# Patient Record
Sex: Female | Born: 1980
Health system: Southern US, Community
[De-identification: ages and names within clinical notes are randomized; demographics above are authoritative.]

## PROBLEM LIST (undated history)

## (undated) DIAGNOSIS — Z789 Other specified health status: Secondary | ICD-10-CM

## (undated) HISTORY — PX: WISDOM TOOTH EXTRACTION: SHX21

---
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
## 2001-02-28 ENCOUNTER — Other Ambulatory Visit: Admission: RE | Admit: 2001-02-28 | Discharge: 2001-02-28 | Payer: Self-pay | Admitting: Obstetrics and Gynecology

## 2001-03-02 ENCOUNTER — Inpatient Hospital Stay (HOSPITAL_COMMUNITY): Admission: AD | Admit: 2001-03-02 | Discharge: 2001-03-02 | Payer: Self-pay | Admitting: Obstetrics and Gynecology

## 2001-05-02 ENCOUNTER — Encounter: Payer: Self-pay | Admitting: Obstetrics and Gynecology

## 2001-05-02 ENCOUNTER — Ambulatory Visit (HOSPITAL_COMMUNITY): Admission: RE | Admit: 2001-05-02 | Discharge: 2001-05-02 | Payer: Self-pay | Admitting: Obstetrics and Gynecology

## 2001-05-13 ENCOUNTER — Inpatient Hospital Stay (HOSPITAL_COMMUNITY): Admission: AD | Admit: 2001-05-13 | Discharge: 2001-05-15 | Payer: Self-pay | Admitting: Obstetrics and Gynecology

## 2001-05-16 ENCOUNTER — Encounter: Admission: RE | Admit: 2001-05-16 | Discharge: 2001-06-15 | Payer: Self-pay | Admitting: Obstetrics and Gynecology

## 2001-07-22 ENCOUNTER — Other Ambulatory Visit: Admission: RE | Admit: 2001-07-22 | Discharge: 2001-07-22 | Payer: Self-pay | Admitting: Obstetrics and Gynecology

## 2002-08-02 ENCOUNTER — Encounter: Payer: Self-pay | Admitting: Family Medicine

## 2002-08-02 ENCOUNTER — Ambulatory Visit (HOSPITAL_COMMUNITY): Admission: RE | Admit: 2002-08-02 | Discharge: 2002-08-02 | Payer: Self-pay | Admitting: Family Medicine

## 2004-03-14 ENCOUNTER — Other Ambulatory Visit: Admission: RE | Admit: 2004-03-14 | Discharge: 2004-03-14 | Payer: Self-pay | Admitting: Obstetrics and Gynecology

## 2005-03-17 ENCOUNTER — Other Ambulatory Visit: Admission: RE | Admit: 2005-03-17 | Discharge: 2005-03-17 | Payer: Self-pay | Admitting: Obstetrics and Gynecology

## 2005-03-20 ENCOUNTER — Ambulatory Visit (HOSPITAL_COMMUNITY): Admission: RE | Admit: 2005-03-20 | Discharge: 2005-03-20 | Payer: Self-pay | Admitting: Plastic Surgery

## 2005-03-20 ENCOUNTER — Ambulatory Visit (HOSPITAL_BASED_OUTPATIENT_CLINIC_OR_DEPARTMENT_OTHER): Admission: RE | Admit: 2005-03-20 | Discharge: 2005-03-20 | Payer: Self-pay | Admitting: Plastic Surgery

## 2006-03-04 ENCOUNTER — Other Ambulatory Visit: Admission: RE | Admit: 2006-03-04 | Discharge: 2006-03-04 | Payer: Self-pay | Admitting: Obstetrics and Gynecology

## 2006-09-08 ENCOUNTER — Other Ambulatory Visit: Admission: RE | Admit: 2006-09-08 | Discharge: 2006-09-08 | Payer: Self-pay | Admitting: Obstetrics and Gynecology

## 2006-10-23 ENCOUNTER — Inpatient Hospital Stay (HOSPITAL_COMMUNITY): Admission: AD | Admit: 2006-10-23 | Discharge: 2006-10-23 | Payer: Self-pay | Admitting: Obstetrics and Gynecology

## 2006-11-15 ENCOUNTER — Inpatient Hospital Stay (HOSPITAL_COMMUNITY): Admission: RE | Admit: 2006-11-15 | Discharge: 2006-11-15 | Payer: Self-pay | Admitting: Obstetrics and Gynecology

## 2006-12-21 ENCOUNTER — Inpatient Hospital Stay (HOSPITAL_COMMUNITY): Admission: AD | Admit: 2006-12-21 | Discharge: 2006-12-21 | Payer: Self-pay | Admitting: Obstetrics and Gynecology

## 2006-12-21 ENCOUNTER — Inpatient Hospital Stay (HOSPITAL_COMMUNITY): Admission: AD | Admit: 2006-12-21 | Discharge: 2006-12-23 | Payer: Self-pay | Admitting: Obstetrics and Gynecology

## 2009-01-06 IMAGING — CT CT HEAD W/O CM
2 of 3 series · 17 of 30 positions shown, 20 images · non-contrast
Comparison: None.

CLINICAL DATA: Headaches. Syncopal episodes today. 32 weeks pregnant.

HEAD CT WITHOUT CONTRAST
TECHNIQUE: 5mm collimated images were obtained from the base of the skull
through the vertex, according to standard protocol, without contrast.

[Series 400: reformatted · sagittal · 0.47mm/px · 5 of 67 slices shown (1 of 2)]
[im 7/67  brain]
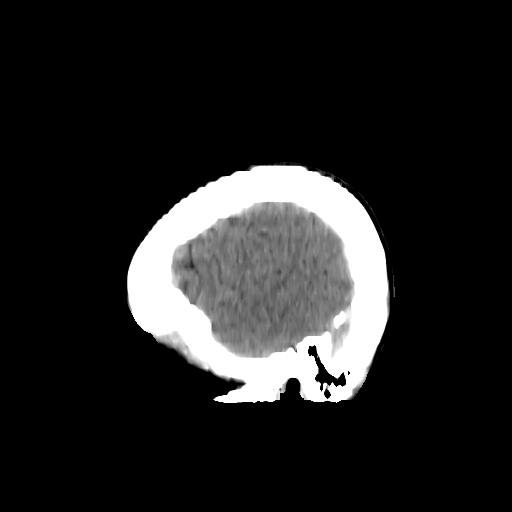
[im 14/67  brain]
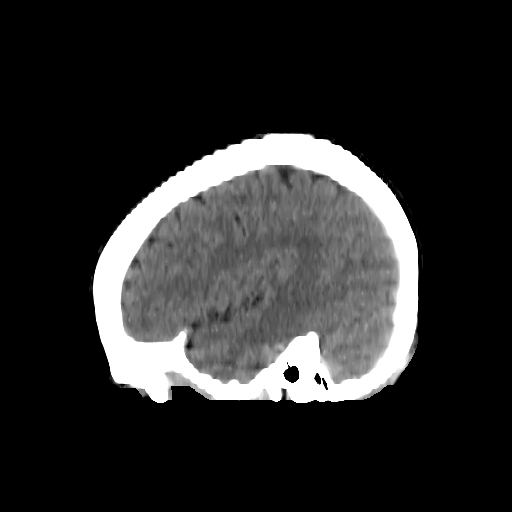
[im 20/67  brain]
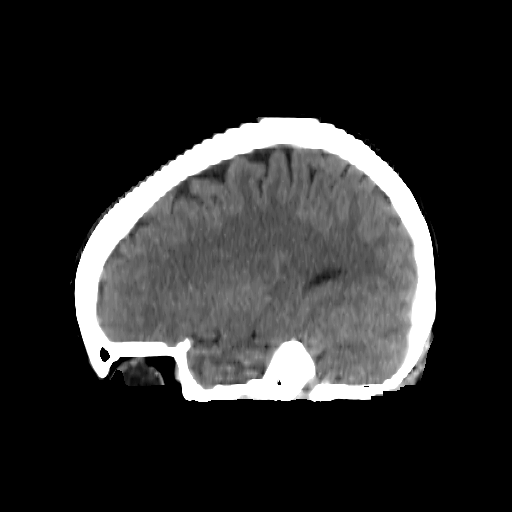
[im 27/67  brain]
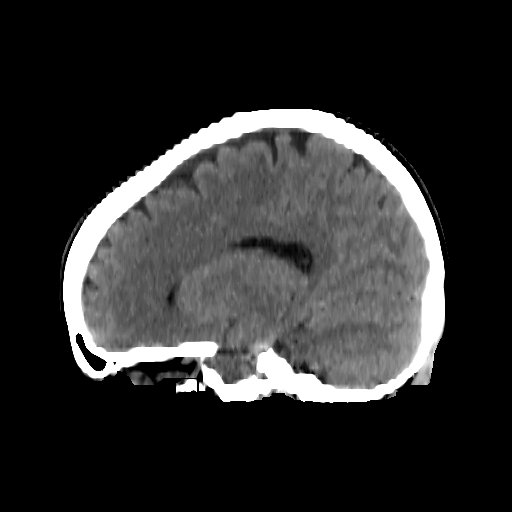
[im 40/67  brain]
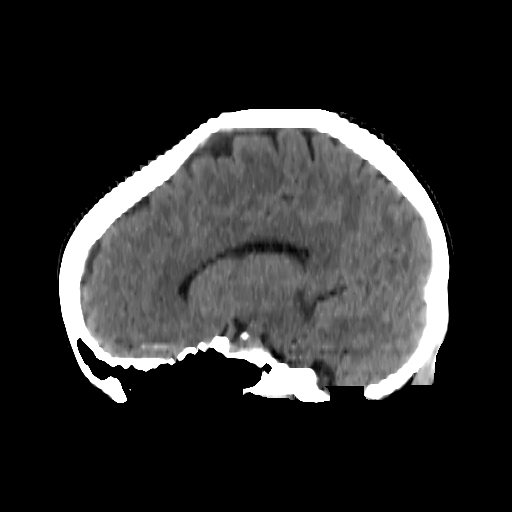

[Series 401: reformatted · coronal · 0.47mm/px · 12 of 86 slices shown, 15 images (2 of 2)]
[im 7/86  brain]
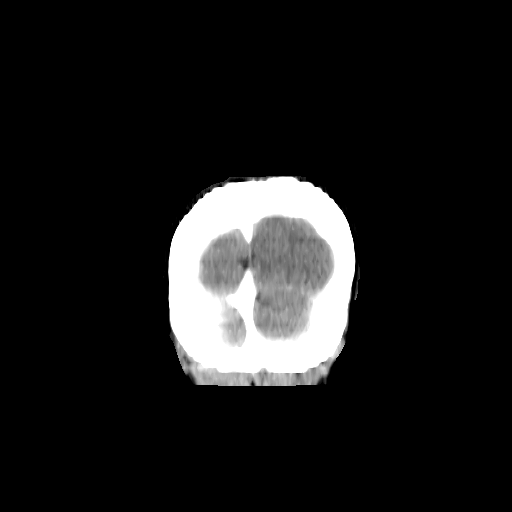
[im 7/86  bone]
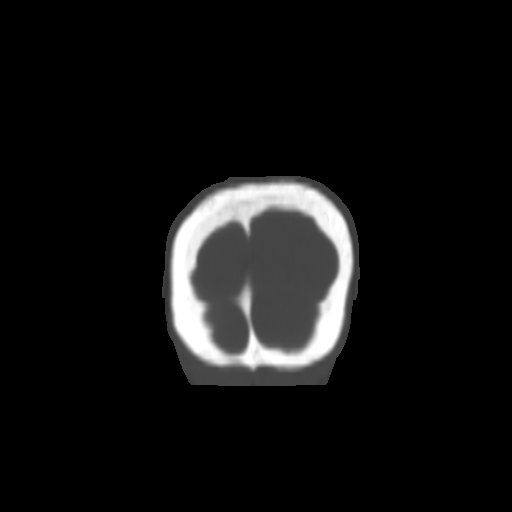
[im 14/86  brain]
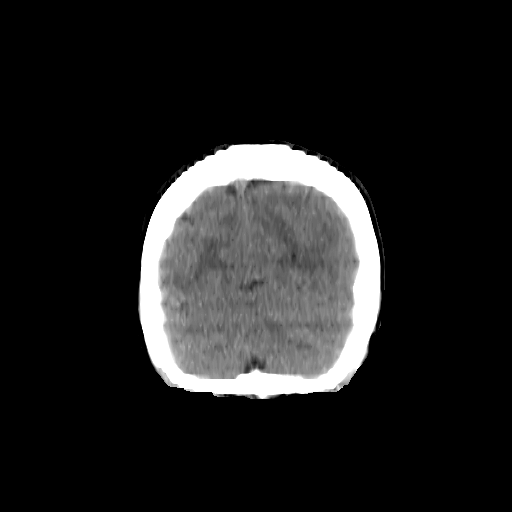
[im 20/86  brain]
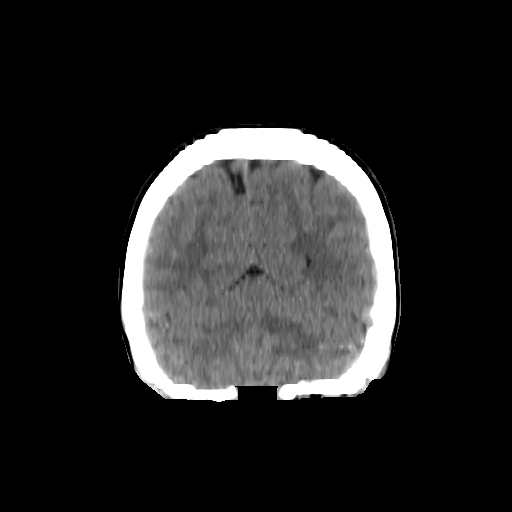
[im 27/86  brain]
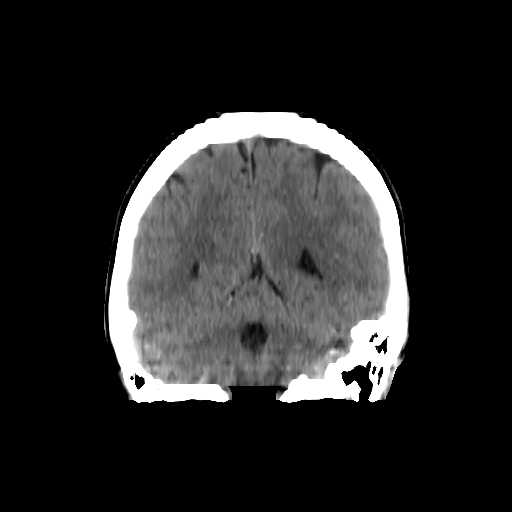
[im 33/86  brain]
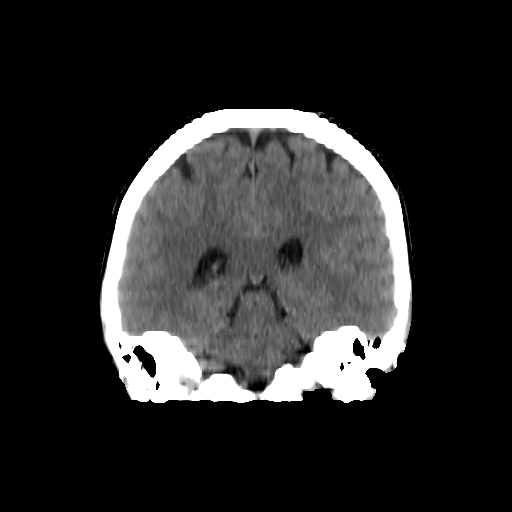
[im 33/86  bone]
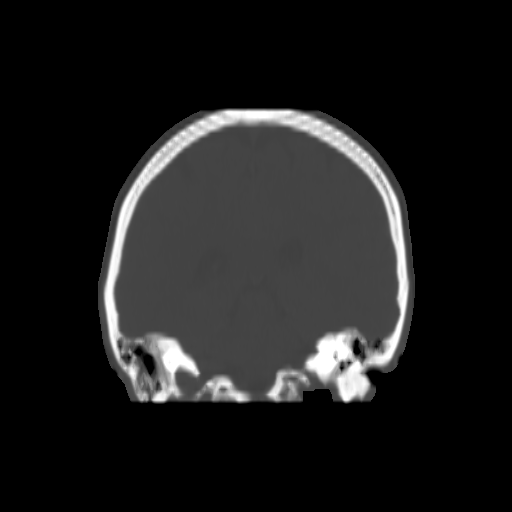
[im 40/86  brain]
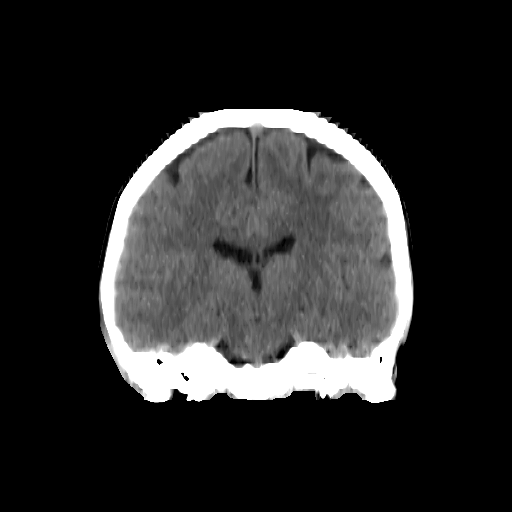
[im 46/86  brain]
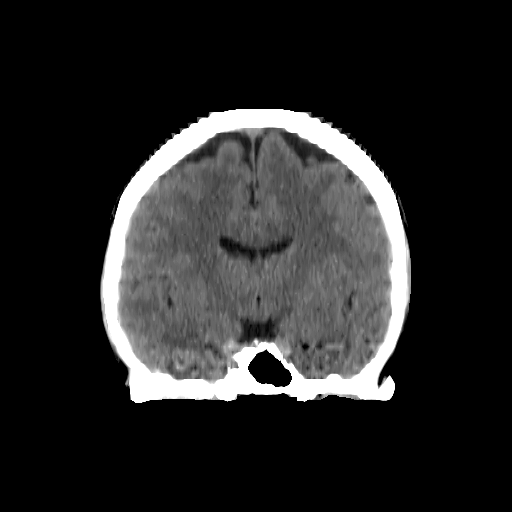
[im 53/86  brain]
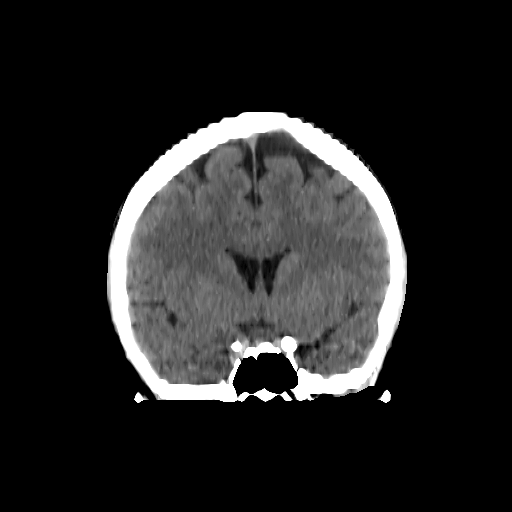
[im 59/86  brain]
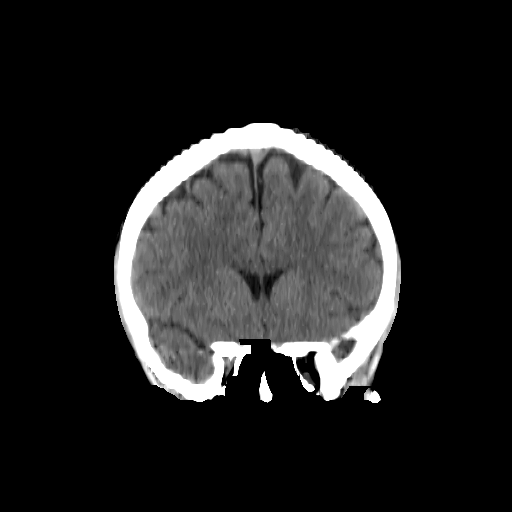
[im 59/86  bone]
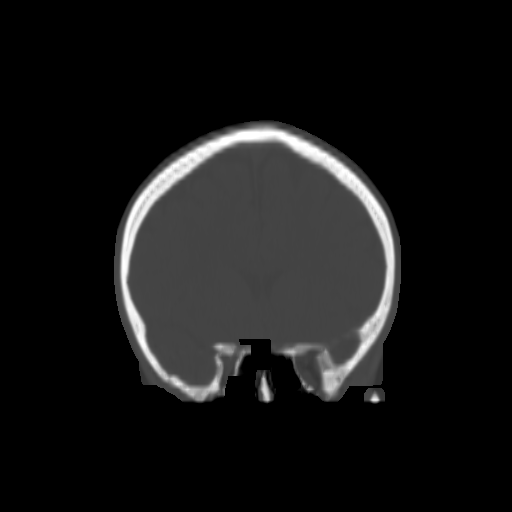
[im 66/86  brain]
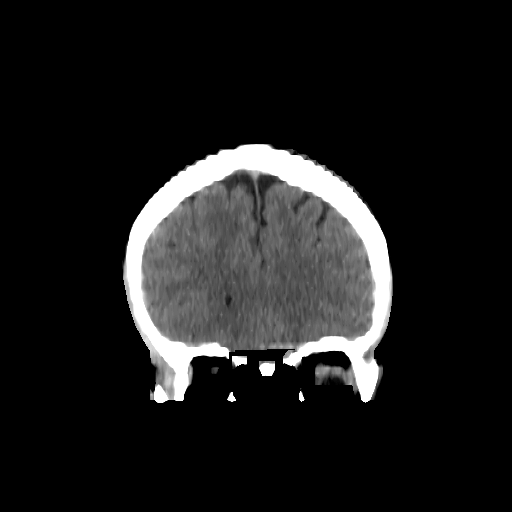
[im 72/86  brain]
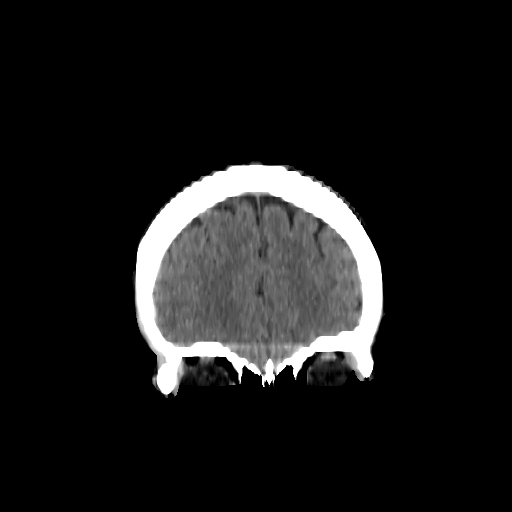
[im 79/86  brain]
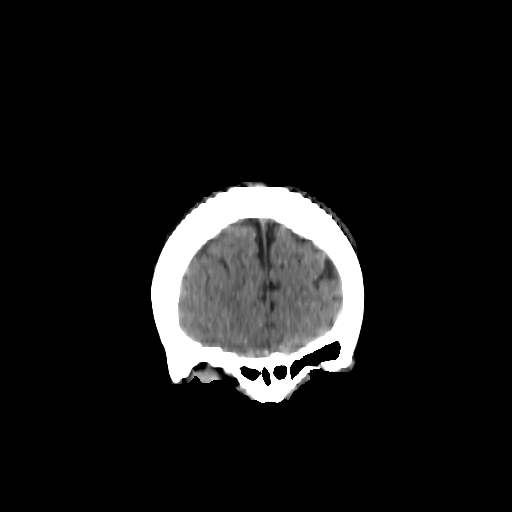

[17 of 30 positions shown; findings below may reference images not displayed]

FINDINGS: Normal appearing cerebral hemispheres and posterior fossa structures.
Normal size and position of the ventricles.  No intracranial hemorrhage, mass,
mass effect or areas of acute infarction identified.  Unremarkable bones and
included portions of the paranasal sinuses.

IMPRESSION

Normal examination.

## 2012-01-15 ENCOUNTER — Ambulatory Visit: Payer: Self-pay | Admitting: Obstetrics and Gynecology

## 2012-01-26 ENCOUNTER — Ambulatory Visit: Payer: Self-pay | Admitting: Obstetrics and Gynecology

## 2012-06-27 ENCOUNTER — Ambulatory Visit: Payer: Self-pay | Admitting: Obstetrics and Gynecology

## 2012-08-24 ENCOUNTER — Ambulatory Visit: Payer: Self-pay | Admitting: Obstetrics and Gynecology

## 2013-11-02 NOTE — L&D Delivery Note (Signed)
0150: Patient reporting increased rectal pressure.  Provider to room and fetus at +2 station.  Patient instructed on pushing techniques.  FHR remained reassuring with variable decelerations noted during pushing, but with quick recovery during rest.  Patient delivered as below.   Delivery Note At 2:08 AM a viable female "Anne Cline" was delivered via Vaginal, Spontaneous Delivery (Presentation: Right Occiput Anterior).  Compound right hand noted, but shoulders delivered easily.  Mother requests and allowed to pull infant to abdomen where nurse provided tactile stimulation and bulb suction.  Infant with spontaneous cry and APGARs of 9, 9; weight 7lbs. Cord clamped, cut, and blood collected. Placenta delivered spontaneously and noted to be intact with 3VC upon inspection. Vaginal inspection revealed intact perineum and vaginal mucosa: several skin tags noted in these areas.  Fundus firm at umbilicus, bleeding scant, mother hemodynamically stable, and infant skin to skin prior to provider exit.    Anesthesia: Epidural  Episiotomy: None Lacerations: None Suture Repair: N/A Est. Blood Loss (mL): 200  Mom to postpartum.  Baby to Couplet care / Skin to Skin. Desires inpatient circumcision  [name] LYNN 07/25/2014, 2:45 AM

## 2013-12-14 LAB — OB RESULTS CONSOLE GC/CHLAMYDIA
Chlamydia: NEGATIVE
Gonorrhea: NEGATIVE

## 2013-12-14 LAB — OB RESULTS CONSOLE RUBELLA ANTIBODY, IGM: Rubella: IMMUNE

## 2013-12-14 LAB — OB RESULTS CONSOLE RPR: RPR: NONREACTIVE

## 2013-12-14 LAB — OB RESULTS CONSOLE ABO/RH: RH Type: NEGATIVE

## 2013-12-14 LAB — OB RESULTS CONSOLE HEPATITIS B SURFACE ANTIGEN: Hepatitis B Surface Ag: NEGATIVE

## 2013-12-14 LAB — OB RESULTS CONSOLE HIV ANTIBODY (ROUTINE TESTING): HIV: NONREACTIVE

## 2013-12-14 LAB — OB RESULTS CONSOLE ANTIBODY SCREEN: Antibody Screen: NEGATIVE

## 2014-07-10 LAB — OB RESULTS CONSOLE GBS: GBS: NEGATIVE

## 2014-07-13 ENCOUNTER — Encounter (HOSPITAL_COMMUNITY): Payer: Self-pay | Admitting: *Deleted

## 2014-07-13 ENCOUNTER — Inpatient Hospital Stay (HOSPITAL_COMMUNITY)
Admission: AD | Admit: 2014-07-13 | Discharge: 2014-07-13 | Disposition: A | Discharge status: Home / Self Care | Payer: BC Managed Care – PPO | Source: Ambulatory Visit | Attending: Obstetrics and Gynecology | Admitting: Obstetrics and Gynecology

## 2014-07-13 DIAGNOSIS — M549 Dorsalgia, unspecified: Secondary | ICD-10-CM | POA: Insufficient documentation

## 2014-07-13 DIAGNOSIS — O479 False labor, unspecified: Secondary | ICD-10-CM | POA: Insufficient documentation

## 2014-07-13 DIAGNOSIS — O99891 Other specified diseases and conditions complicating pregnancy: Secondary | ICD-10-CM | POA: Insufficient documentation

## 2014-07-13 DIAGNOSIS — O9989 Other specified diseases and conditions complicating pregnancy, childbirth and the puerperium: Principal | ICD-10-CM

## 2014-07-13 HISTORY — DX: Other specified health status: Z78.9

## 2014-07-13 LAB — URINE MICROSCOPIC-ADD ON

## 2014-07-13 LAB — URINALYSIS, ROUTINE W REFLEX MICROSCOPIC
Bilirubin Urine: NEGATIVE
Glucose, UA: NEGATIVE mg/dL
Ketones, ur: 40 mg/dL — AB
Nitrite: NEGATIVE
Protein, ur: NEGATIVE mg/dL
Specific Gravity, Urine: 1.025 (ref 1.005–1.030)
Urobilinogen, UA: 0.2 mg/dL (ref 0.0–1.0)
pH: 5.5 (ref 5.0–8.0)

## 2014-07-13 MED ORDER — CEPHALEXIN 500 MG PO CAPS: 500.0000 mg | ORAL_CAPSULE | Freq: Two times a day (BID) | ORAL | Status: AC

## 2014-07-13 NOTE — MAU Note (Signed)
Lower pelvic pain & pressure, back & leg pain, irregular uc's, nausea, HA.  Bloody show, denies LOF.

## 2014-07-13 NOTE — MAU Provider Note (Signed)
MAU Addendum Note  Results for orders placed during the hospital encounter of 07/13/14 (from the past 24 hour(s))  URINALYSIS, ROUTINE W REFLEX MICROSCOPIC     Status: Abnormal   Collection Time    07/13/14 12:50 PM      Result Value Ref Range   Color, Urine YELLOW  YELLOW   APPearance HAZY (*) CLEAR   Specific Gravity, Urine 1.025  1.005 - 1.030   pH 5.5  5.0 - 8.0   Glucose, UA NEGATIVE  NEGATIVE mg/dL   Hgb urine dipstick SMALL (*) NEGATIVE   Bilirubin Urine NEGATIVE  NEGATIVE   Ketones, ur 40 (*) NEGATIVE mg/dL   Protein, ur NEGATIVE  NEGATIVE mg/dL   Urobilinogen, UA 0.2  0.0 - 1.0 mg/dL   Nitrite NEGATIVE  NEGATIVE   Leukocytes, UA SMALL (*) NEGATIVE  URINE MICROSCOPIC-ADD ON     Status: Abnormal   Collection Time    07/13/14 12:50 PM      Result Value Ref Range   Squamous Epithelial / LPF MANY (*) RARE   WBC, UA 7-10  <3 WBC/hpf   RBC / HPF 3-6  <3 RBC/hpf   Bacteria, UA MANY (*) RARE   Urine-Other MUCOUS PRESENT     Pt declined IVF, LABS and [place] for decreased fetal movement FHR 120 + accel, moderate variability, no decel, ctx q6-64minutes Keflex  bid x7 DC to home with labor precaution and kick counts FU in the office in 1 week   Anne Cline, CNM, MSN 07/13/2014. 2:55 PM

## 2014-07-13 NOTE — MAU Note (Signed)
Also decreased FM since yesterday.

## 2014-07-13 NOTE — MAU Provider Note (Signed)
Anne Cline is a 33 y.o. G3P2002 at 37.2 weeks presents to the MAU c/o back ache, cramps, nausea, musas discharge and occasional leg cramps.  Denies ctx, vb or lof w/a little decrease in fetal movement.  Pt declines IV fluid, labs and an [place].     History    There are no active problems to display for this patient.   Chief Complaint  Patient presents with  . Labor Eval   HPI  OB History   Grav Para Term Preterm Abortions TAB SAB Ect Mult Living   Past Medical History  Diagnosis Date  . Medical history non-contributory     History reviewed. No pertinent past surgical history.  History reviewed. No pertinent family history.  History  Substance Use Topics  . Smoking status: Never Smoker   . Smokeless tobacco: Never Used  . Alcohol Use: No    Allergies: Allergies not on file  No prescriptions prior to admission    ROS See HPI above, all other systems are negative  Physical Exam   Blood pressure 113/81, pulse 101, temperature 98.2 F (36.8 C), temperature source Oral, resp. rate 20.  Physical Exam  Ext:  WNL ABD: Soft, non tender to palpation, no rebound or guarding SVE: 3/50/-3   ED Course  Assessment: IUP at  37.2 weeks Membranes: intact FHR: Category 1 CTX:  10 minutes mild   Plan: PO hydration Observe and recheck in 1 hour for cervical change   Ramel Tobon, CNM, MSN 07/13/2014. 1:30 PM

## 2014-07-13 NOTE — Discharge Instructions (Signed)

## 2014-07-24 ENCOUNTER — Inpatient Hospital Stay (HOSPITAL_COMMUNITY)
Admission: AD | Admit: 2014-07-24 | Discharge: 2014-07-26 | DRG: 775 | Disposition: A | Discharge status: Home / Self Care | Payer: Medicaid Other | Source: Ambulatory Visit | Attending: Obstetrics & Gynecology | Admitting: Obstetrics & Gynecology

## 2014-07-24 ENCOUNTER — Encounter (HOSPITAL_COMMUNITY): Payer: Self-pay | Admitting: *Deleted

## 2014-07-24 ENCOUNTER — Inpatient Hospital Stay (HOSPITAL_COMMUNITY): Payer: Medicaid Other | Admitting: Anesthesiology

## 2014-07-24 ENCOUNTER — Encounter (HOSPITAL_COMMUNITY): Payer: Medicaid Other | Admitting: Anesthesiology

## 2014-07-24 DIAGNOSIS — O328XX0 Maternal care for other malpresentation of fetus, not applicable or unspecified: Secondary | ICD-10-CM | POA: Diagnosis present

## 2014-07-24 DIAGNOSIS — N898 Other specified noninflammatory disorders of vagina: Secondary | ICD-10-CM | POA: Diagnosis present

## 2014-07-24 DIAGNOSIS — L918 Other hypertrophic disorders of the skin: Secondary | ICD-10-CM | POA: Diagnosis present

## 2014-07-24 DIAGNOSIS — IMO0001 Reserved for inherently not codable concepts without codable children: Secondary | ICD-10-CM

## 2014-07-24 DIAGNOSIS — O360990 Maternal care for other rhesus isoimmunization, unspecified trimester, not applicable or unspecified: Secondary | ICD-10-CM | POA: Diagnosis present

## 2014-07-24 DIAGNOSIS — O479 False labor, unspecified: Secondary | ICD-10-CM | POA: Diagnosis present

## 2014-07-24 LAB — CBC
HCT: 34.9 % — ABNORMAL LOW (ref 36.0–46.0)
Hemoglobin: 11.6 g/dL — ABNORMAL LOW (ref 12.0–15.0)
MCH: 29.6 pg (ref 26.0–34.0)
MCHC: 33.2 g/dL (ref 30.0–36.0)
MCV: 89 fL (ref 78.0–100.0)
Platelets: 197 10*3/uL (ref 150–400)
RBC: 3.92 MIL/uL (ref 3.87–5.11)
RDW: 13.8 % (ref 11.5–15.5)
WBC: 13.7 10*3/uL — ABNORMAL HIGH (ref 4.0–10.5)

## 2014-07-24 MED ORDER — NALBUPHINE HCL 10 MG/ML IJ SOLN: 10.0000 mg | INTRAMUSCULAR | Status: DC | PRN

## 2014-07-24 MED ORDER — LACTATED RINGERS IV SOLN
500.0000 mL | Freq: Once | INTRAVENOUS | Status: AC
  Administered 2014-07-24: 500 mL via INTRAVENOUS

## 2014-07-24 MED ORDER — LIDOCAINE HCL (PF) 1 % IJ SOLN
30.0000 mL | INTRAMUSCULAR | Status: DC | PRN
  Filled 2014-07-24: qty 30

## 2014-07-24 MED ORDER — OXYTOCIN BOLUS FROM INFUSION
500.0000 mL | INTRAVENOUS | Status: DC
  Administered 2014-07-25: 500 mL via INTRAVENOUS

## 2014-07-24 MED ORDER — EPHEDRINE 5 MG/ML INJ
10.0000 mg | INTRAVENOUS | Status: DC | PRN
  Filled 2014-07-24: qty 2

## 2014-07-24 MED ORDER — OXYCODONE-ACETAMINOPHEN 5-325 MG PO TABS: 1.0000 | ORAL_TABLET | ORAL | Status: DC | PRN

## 2014-07-24 MED ORDER — FENTANYL 2.5 MCG/ML BUPIVACAINE 1/10 % EPIDURAL INFUSION (WH - ANES)
14.0000 mL/h | INTRAMUSCULAR | Status: DC | PRN
  Administered 2014-07-24 (×2): 14 mL/h via EPIDURAL
  Filled 2014-07-24: qty 125

## 2014-07-24 MED ORDER — LIDOCAINE HCL (PF) 1 % IJ SOLN
INTRAMUSCULAR | Status: DC | PRN
  Administered 2014-07-24 (×2): 5 mL

## 2014-07-24 MED ORDER — PHENYLEPHRINE 40 MCG/ML (10ML) SYRINGE FOR IV PUSH (FOR BLOOD PRESSURE SUPPORT)
80.0000 ug | PREFILLED_SYRINGE | INTRAVENOUS | Status: DC | PRN
  Filled 2014-07-24: qty 2

## 2014-07-24 MED ORDER — OXYCODONE-ACETAMINOPHEN 5-325 MG PO TABS: 2.0000 | ORAL_TABLET | ORAL | Status: DC | PRN

## 2014-07-24 MED ORDER — ONDANSETRON HCL 4 MG/2ML IJ SOLN: 4.0000 mg | Freq: Four times a day (QID) | INTRAMUSCULAR | Status: DC | PRN

## 2014-07-24 MED ORDER — OXYTOCIN 40 UNITS IN LACTATED RINGERS INFUSION - SIMPLE MED
1.0000 m[IU]/min | INTRAVENOUS | Status: DC
  Administered 2014-07-24: 0.667 m[IU]/min via INTRAVENOUS
  Filled 2014-07-24: qty 1000

## 2014-07-24 MED ORDER — CITRIC ACID-SODIUM CITRATE 334-500 MG/5ML PO SOLN: 30.0000 mL | ORAL | Status: DC | PRN

## 2014-07-24 MED ORDER — LACTATED RINGERS IV SOLN
INTRAVENOUS | Status: DC
  Administered 2014-07-24: 23:00:00 via INTRAVENOUS

## 2014-07-24 MED ORDER — TERBUTALINE SULFATE 1 MG/ML IJ SOLN: 0.2500 mg | Freq: Once | INTRAMUSCULAR | Status: AC | PRN

## 2014-07-24 MED ORDER — ACETAMINOPHEN 325 MG PO TABS: 650.0000 mg | ORAL_TABLET | ORAL | Status: DC | PRN

## 2014-07-24 MED ORDER — PHENYLEPHRINE 40 MCG/ML (10ML) SYRINGE FOR IV PUSH (FOR BLOOD PRESSURE SUPPORT)
80.0000 ug | PREFILLED_SYRINGE | INTRAVENOUS | Status: DC | PRN
  Filled 2014-07-24: qty 2
  Filled 2014-07-24: qty 10

## 2014-07-24 MED ORDER — DIPHENHYDRAMINE HCL 50 MG/ML IJ SOLN: 12.5000 mg | INTRAMUSCULAR | Status: DC | PRN

## 2014-07-24 MED ORDER — OXYTOCIN 40 UNITS IN LACTATED RINGERS INFUSION - SIMPLE MED: 62.5000 mL/h | INTRAVENOUS | Status: DC

## 2014-07-24 MED ORDER — LACTATED RINGERS IV SOLN: 500.0000 mL | INTRAVENOUS | Status: DC | PRN

## 2014-07-24 NOTE — MAU Note (Signed)
Report given to [facility], CCC/BS. Patient will go to room 162.

## 2014-07-24 NOTE — Progress Notes (Signed)
SAMYIAH HALVORSEN MRN: [id number]  Subjective: -Patient desired epidural prior to pitocin initiation.  Reports contractions getting stronger.  Epidural in place and patient comfortable.   Objective: BP 112/77  Pulse 97  Temp(Src) 98.5 F (36.9 C) (Oral)  Resp 18  Ht 5' (1.524 m)  Wt 140 lb (63.504 kg)  BMI 27.34 kg/m2  SpO2 98%     FHT:  125 bpm, Mod Var, -Decels, +Accels UC:   Irregular SVE:   Dilation: 6 Effacement (%): 50 Station: -2 Exam by:: Gerrit Heck Membranes:SROM at 1700 Pitocin: Initiated at  Assessment:  IUP at 38.6wks Cat I FT  SROM Inadequate Contractions Labor Augmentation  Plan: -Start pitocin -Will continue to observe -Continue other mgmt as ordered  [facility], Rucha Wissinger LYNN,CNM, MSN 07/24/2014, 9:44 PM

## 2014-07-24 NOTE — Anesthesia Procedure Notes (Signed)
Epidural Patient location during procedure: OB Start time: 07/24/2014 8:43 PM  Staffing Anesthesiologist: Brayton Caves Performed by: anesthesiologist   Preanesthetic Checklist Completed: patient identified, site marked, surgical consent, pre-op evaluation, timeout performed, IV checked, risks and benefits discussed and monitors and equipment checked  Epidural Patient position: sitting Prep: site prepped and draped and DuraPrep Patient monitoring: continuous pulse ox and blood pressure Approach: midline Location: L3-L4 Injection technique: LOR air  Needle:  Needle type: Tuohy  Needle gauge: 17 G Needle length: 9 cm and 9 Needle insertion depth: 5 cm cm Catheter type: closed end flexible Catheter size: 19 Gauge Catheter at skin depth: 10 cm Test dose: negative  Assessment Events: blood not aspirated, injection not painful, no injection resistance, negative IV test and no paresthesia  Additional Notes Patient identified.  Risk benefits discussed including failed block, incomplete pain control, headache, nerve damage, paralysis, blood pressure changes, nausea, vomiting, reactions to medication both toxic or allergic, and postpartum back pain.  Patient expressed understanding and wished to proceed.  All questions were answered.  Sterile technique used throughout procedure and epidural site dressed with sterile barrier dressing. No paresthesia or other complications noted.The patient did not experience any signs of intravascular injection such as tinnitus or metallic taste in mouth nor signs of intrathecal spread such as rapid motor block. Please see nursing notes for vital signs.

## 2014-07-24 NOTE — H&P (Signed)
Anne Cline is a 33 y.o. female, G3P2002 at 38.6 weeks, presenting for SROM.  Patient states she has been having irregular contractions throughout the day.  Patient reports gush of fluid around 1700 while at her daughters game.  Patient reports active fetus and denies VB.  Patient denies pain at current and reports contractions are mild.   Patient Active Problem List   Diagnosis Date Noted  . Active labor 07/24/2014    History of present pregnancy: Patient entered care at 8.3 weeks, but unable to be evaluated until 11.6wks for financial reasons.   EDC of 08/07/2014 was established by Definite LMP of 10/31/2013.   Anatomy scan:  19.6 weeks, with normal findings and an right lateral placenta.   Additional [place] evaluations:  None.   Significant prenatal events:  Patient had common pregnancy complaints including round ligament pain, nausea/vomiting, and carpal tunnel sx; all self limiting and required no medication intervention   Last evaluation:  07/23/2014 by Dr. Carmela Hurt at 37.6wks, 4/70/-2  OB History   Grav Para Term Preterm Abortions TAB SAB Ect Mult Living   Past Medical History  Diagnosis Date  . Medical history non-contributory    Past Surgical History  Procedure Laterality Date  . Wisdom tooth extraction     Family History: family history is not on file. Social History:  reports that she has never smoked. She has never used smokeless tobacco. She reports that she does not drink alcohol or use illicit drugs.   Prenatal Transfer Tool  Maternal Diabetes: No Genetic Screening: Normal Maternal Ultrasounds/Referrals: Normal Fetal Ultrasounds or other Referrals:  None Maternal Substance Abuse:  No Significant Maternal Medications:  Meds include: Other: Rhogam IM on 05/21/2014 Significant Maternal Lab Results: Lab values include: Group B Strep negative, Rh negative    ROS:  See HPI Above  No Known Allergies   Dilation: 6 Effacement (%): 50 Station:  -2 Exam by:: Gerrit Heck Blood pressure 120/79, pulse 116, temperature 98.5 F (36.9 C), temperature source Oral, resp. rate 20, height 5' (1.524 m), weight 140 lb (63.504 kg), SpO2 97.00%.  Chest clear Heart RRR without murmur Abd gravid, NT Pelvic: See Above, Infant LOT  Ext: BLE with pitting edema  FHR: 125 bpm, Mod Var, -Decels, +Accels UCs:  Q4-19min, palpates mild  Prenatal labs: ABO, Rh: A/Negative/-- (02/12 0000) Antibody: Negative (02/12 0000) Rubella:   Immune RPR: Nonreactive (02/12 0000)  HBsAg: Negative (02/12 0000)  HIV: Non-reactive (02/12 0000)  GBS: Negative (09/08 0000) Sickle cell/Hgb electrophoresis:  N/A Pap:  Unknown GC:  Negative Chlamydia:  Negative Genetic screenings:  Normal Glucola:  Negative Other:  None    Assessment IUP at 38wks Cat I FT SROM  Afebrile GBS Negative  Plan: Admit to [facility] per consult with Dr. Carmela Hurt Routine Labor and Delivery Orders per [facility] Protocol Patient desires to have no pain mgmt for labor and delivery Discussed allowing 4 hrs, from admission, before initiation of pitocin Patient without questions or concerns at current Will reassess prn  Damione Robideau LYNNCNM, MSN 07/24/2014, 7:33 PM   2000 Augmentation orders placed Initiate pitocin now Orders given to RN Will update patient and call with any questions or concerns  Kimani Bedoya LYNN, CNM

## 2014-07-24 NOTE — Anesthesia Preprocedure Evaluation (Signed)

## 2014-07-24 NOTE — MAU Note (Signed)
Patient states she started leaking clear fluid at 1700. Continues to leak fluid and clothing are wet. Irregular contraction. Reports no fetal movement since ROM.Fetal heart rate in triage in the 130's.

## 2014-07-25 ENCOUNTER — Encounter (HOSPITAL_COMMUNITY): Payer: Self-pay | Admitting: General Practice

## 2014-07-25 DIAGNOSIS — N898 Other specified noninflammatory disorders of vagina: Secondary | ICD-10-CM | POA: Diagnosis present

## 2014-07-25 DIAGNOSIS — L918 Other hypertrophic disorders of the skin: Secondary | ICD-10-CM | POA: Diagnosis present

## 2014-07-25 LAB — RAPID HIV SCREEN (WH-MAU): Rapid HIV Screen: NONREACTIVE

## 2014-07-25 LAB — CBC
HCT: 30.2 % — ABNORMAL LOW (ref 36.0–46.0)
Hemoglobin: 9.9 g/dL — ABNORMAL LOW (ref 12.0–15.0)
MCH: 29.1 pg (ref 26.0–34.0)
MCHC: 32.8 g/dL (ref 30.0–36.0)
MCV: 88.8 fL (ref 78.0–100.0)
Platelets: 159 10*3/uL (ref 150–400)
RBC: 3.4 MIL/uL — ABNORMAL LOW (ref 3.87–5.11)
RDW: 13.8 % (ref 11.5–15.5)
WBC: 19.2 10*3/uL — ABNORMAL HIGH (ref 4.0–10.5)

## 2014-07-25 LAB — RPR

## 2014-07-25 MED ORDER — IBUPROFEN 600 MG PO TABS
600.0000 mg | ORAL_TABLET | Freq: Four times a day (QID) | ORAL | Status: DC
  Administered 2014-07-25 – 2014-07-26 (×6): 600 mg via ORAL
  Filled 2014-07-25 (×6): qty 1

## 2014-07-25 MED ORDER — DIBUCAINE 1 % RE OINT: 1.0000 "application " | TOPICAL_OINTMENT | RECTAL | Status: DC | PRN

## 2014-07-25 MED ORDER — OXYCODONE-ACETAMINOPHEN 5-325 MG PO TABS
1.0000 | ORAL_TABLET | ORAL | Status: DC | PRN
Start: 2014-07-25 — End: 2014-07-26

## 2014-07-25 MED ORDER — ONDANSETRON HCL 4 MG PO TABS: 4.0000 mg | ORAL_TABLET | ORAL | Status: DC | PRN

## 2014-07-25 MED ORDER — LANOLIN HYDROUS EX OINT: TOPICAL_OINTMENT | CUTANEOUS | Status: DC | PRN

## 2014-07-25 MED ORDER — SENNOSIDES-DOCUSATE SODIUM 8.6-50 MG PO TABS
2.0000 | ORAL_TABLET | ORAL | Status: DC
  Administered 2014-07-25: 2 via ORAL
  Filled 2014-07-25: qty 2

## 2014-07-25 MED ORDER — WITCH HAZEL-GLYCERIN EX PADS: 1.0000 "application " | MEDICATED_PAD | CUTANEOUS | Status: DC | PRN

## 2014-07-25 MED ORDER — ZOLPIDEM TARTRATE 5 MG PO TABS: 5.0000 mg | ORAL_TABLET | Freq: Every evening | ORAL | Status: DC | PRN

## 2014-07-25 MED ORDER — BENZOCAINE-MENTHOL 20-0.5 % EX AERO
1.0000 "application " | INHALATION_SPRAY | CUTANEOUS | Status: DC | PRN
  Filled 2014-07-25: qty 56

## 2014-07-25 MED ORDER — TETANUS-DIPHTH-ACELL PERTUSSIS 5-2.5-18.5 LF-MCG/0.5 IM SUSP: 0.5000 mL | Freq: Once | INTRAMUSCULAR | Status: DC

## 2014-07-25 MED ORDER — ONDANSETRON HCL 4 MG/2ML IJ SOLN: 4.0000 mg | INTRAMUSCULAR | Status: DC | PRN

## 2014-07-25 MED ORDER — DIPHENHYDRAMINE HCL 25 MG PO CAPS: 25.0000 mg | ORAL_CAPSULE | Freq: Four times a day (QID) | ORAL | Status: DC | PRN

## 2014-07-25 MED ORDER — SIMETHICONE 80 MG PO CHEW: 80.0000 mg | CHEWABLE_TABLET | ORAL | Status: DC | PRN

## 2014-07-25 MED ORDER — PRENATAL MULTIVITAMIN CH
1.0000 | ORAL_TABLET | Freq: Every day | ORAL | Status: DC
  Administered 2014-07-25 – 2014-07-26 (×2): 1 via ORAL
  Filled 2014-07-25 (×2): qty 1

## 2014-07-25 MED ORDER — OXYCODONE-ACETAMINOPHEN 5-325 MG PO TABS: 2.0000 | ORAL_TABLET | ORAL | Status: DC | PRN

## 2014-07-25 NOTE — Progress Notes (Signed)
Anne Cline MRN: [id number]  Subjective: -In to check patient.  Reports intermittent rectal pressure, minimal to no urge to push.   Objective: BP 100/69  Pulse 116  Temp(Src) 98.2 F (36.8 C) (Oral)  Resp 18  Ht 5' (1.524 m)  Wt 140 lb (63.504 kg)  BMI 27.34 kg/m2  SpO2 98%   Total I/O In: -  Out: 750 [Urine:750] FHT:  125 bpm, Mod Var, -Decels, +Accels UC:   Q2-85min SVE:   Deferred Membranes:SROM x 8 hours Pitocin:52mUn/min  Assessment:  IUP at 39wks Cat I FT 2nd Stage Labor   Plan: -Allow additional time for fetal descent -Continue other mgmt as ordered -Anticipate SVD   Anne Cline,CNM, MSN 07/25/2014, 1:03 AM

## 2014-07-25 NOTE — Lactation Note (Signed)
This note was copied from the chart of Anne Audrena Talaga. Lactation Consultation Note  P3, Ex BF.  Baby sleeping in mother's arms. Mother states she knows how to hand express and this baby has trouble with organizing his suck. Reviewed  Suck training, cluster feeding.  Baby recently breastfed for 10 min on each side. Mom encouraged to feed baby 8-12 times/24 hours and with feeding cues.  Mom made aware of O/P services, breastfeeding support groups, community resources, and our phone # for post-discharge questions.    Patient Name: Anne Cline [id number] Date: 07/25/2014 Reason for consult: Initial assessment   Maternal Data Has patient been taught Hand Expression?: Yes Does the patient have breastfeeding experience prior to this delivery?: Yes  Feeding Feeding Type: Breast Fed Length of feed: 10 min  LATCH Score/Interventions Latch: Repeated attempts needed to sustain latch, nipple held in mouth throughout feeding, stimulation needed to elicit sucking reflex. Intervention(s): Adjust position  Audible Swallowing: A few with stimulation  Type of Nipple: Everted at rest and after stimulation  Comfort (Breast/Nipple): Soft / non-tender     Hold (Positioning): No assistance needed to correctly position infant at breast.  LATCH Score: 8  Lactation Tools Discussed/Used     Consult Status Consult Status: Follow-up Date: 07/26/14 Follow-up type: In-patient    Dahlia Byes [facility] 07/25/2014, 9:10 PM

## 2014-07-25 NOTE — Anesthesia Postprocedure Evaluation (Signed)
Anesthesia Post Note  Patient: Anne Cline  Procedure(s) Performed: * No procedures listed *  Anesthesia type: Epidural  Patient location: Mother/Baby  Post pain: Pain level controlled  Post assessment: Post-op Vital signs reviewed  Last Vitals:  Filed Vitals:   07/25/14 0520  BP: 105/72  Pulse: 108  Temp: 36.2 C  Resp: 18    Post vital signs: Reviewed  Level of consciousness: awake  Complications: No apparent anesthesia complications

## 2014-07-25 NOTE — Progress Notes (Signed)
Anne Cline MRN: [id number]  Subjective: -Patient requests cervical exam.  Reporting some detection of contractions.  Objective: BP 119/75  Pulse 97  Temp(Src) 98.2 F (36.8 C) (Oral)  Resp 18  Ht 5' (1.524 m)  Wt 140 lb (63.504 kg)  BMI 27.34 kg/m2  SpO2 98%   Total I/O In: -  Out: 750 [Urine:750] FHT:  120 bpm, Mod Var, + Early Decels, +Accels UC:  Q2-54min, palpates moderate to strong  SVE:   Dilation: 10 Effacement (%): 100 Station: 0 Exam by:: Gerrit Heck Membranes: SROM at 1700 Pitocin:81mUn/min  Assessment:  IUP at 39wks Cat I FT  2nd Stage Labor  Plan: -Allow for fetal descent -Will reassess in 30 min to 1 hour -Dr. Carmela Hurt updated on patient progress -Anticipate SVD  Anne Cline,CNM, MSN 07/25/2014, 12:04 AM

## 2014-07-25 NOTE — Progress Notes (Signed)
Subjective: Postpartum Day 0: Vaginal delivery, no laceration Patient up ad lib, reports no syncope or dizziness. Feeding:  Breast Contraceptive plan:  Undecided  Objective: Vital signs in last 24 hours: Temp:  [97.2 F (36.2 C)-98.6 F (37 C)] 97.2 F (36.2 C) (09/23 0520) Pulse Rate:  [88-116] 108 (09/23 0520) Resp:  [16-20] 18 (09/23 0520) BP: (98-129)/(57-98) 105/72 mmHg (09/23 0520) SpO2:  [97 %-99 %] 98 % (09/23 0520) Weight:  [140 lb (63.504 kg)] 140 lb (63.504 kg) (09/22 1812)  Physical Exam:  General: alert Lochia: appropriate Uterine Fundus: firm Perineum: Perineum intact DVT Evaluation: No evidence of DVT seen on physical exam. Negative Homan's sign.    Recent Labs  07/24/14 1950 07/25/14 0606  HGB 11.6* 9.9*  HCT 34.9* 30.2*    Assessment/Plan: Status post vaginal delivery day 0. Stable Continue current care. Plan for discharge tomorrow Family now planning office circumcision.    Nyra Capes 07/25/2014, 8:33 AM

## 2014-07-26 MED ORDER — FERROUS SULFATE 325 (65 FE) MG PO TABS
325.0000 mg | ORAL_TABLET | Freq: Every day | ORAL | Status: DC
Start: 2014-07-26 — End: 2017-09-09

## 2014-07-26 MED ORDER — IBUPROFEN 600 MG PO TABS: 600.0000 mg | ORAL_TABLET | Freq: Four times a day (QID) | ORAL | Status: DC

## 2014-07-26 MED ORDER — OXYCODONE-ACETAMINOPHEN 5-325 MG PO TABS: 1.0000 | ORAL_TABLET | ORAL | Status: DC | PRN

## 2014-07-26 NOTE — Discharge Summary (Signed)
Vaginal Delivery Discharge Summary  ALL information will be verified prior to discharge  Anne Cline  DOB:    09/29/1981 MRN:    [id number] CSN:    [id number]  Date of admission:                  07/24/14  Date of discharge:                   07/26/14  Procedures this admission: SVD  Date of Delivery: 07/25/14  Newborn Data:  Live born  Information for the patient's newborn:  Anne, Cline [[id number]]  female  Live born female  Birth Weight: 7 lb (3175 g) APGAR: 9, 9  Home with mother. Name: Anne Cline Most Circumcision Plan: out patient  History of Present Illness: [name] is a 33 y.o. female, G3P3003, who presents at [redacted]w[redacted]d weeks gestation. The patient has been followed at the [facility] of [facility]. She was admitted onset of labor. Her pregnancy has been complicated by:  Patient Active Problem List   Diagnosis Date Noted  . SVD (spontaneous vaginal delivery) 07/25/2014  . Skin tag of vaginal mucosa 07/25/2014    Hospital course: The patient was admitted for labor.   Her labor was not complicated. She proceeded to have a vaginal delivery of a healthy infant. Her delivery was not complicated. Her postpartum course was not complicated. She was discharged to home on postpartum day 1 doing well.  Feeding: breast  Contraception: unsure  Discharge hemoglobin: Hemoglobin  Date Value Ref Range Status  07/25/2014 9.9* 12.0 - 15.0 g/dL Final     HCT  Date Value Ref Range Status  07/25/2014 30.2* 36.0 - 46.0 % Final    PreNatal Labs ABO, Rh: A/Negative/-- (02/12 0000)   Antibody: Negative (02/12 0000) Rubella:   immune RPR: NON REAC (09/22 1950)  HBsAg: Negative (02/12 0000)  HIV: Non-reactive (02/12 0000)  GBS: Negative (09/08 0000)  Discharge Physical Exam:  General: alert and cooperative Lochia: appropriate Uterine Fundus: firm Incision: n/a DVT Evaluation: No evidence of DVT seen on  physical exam.  Intrapartum Procedures: spontaneous vaginal delivery Postpartum Procedures: none Complications-Operative and Postpartum: none Discharge Diagnoses: Term Pregnancy-delivered   Activity:           pelvic rest Diet:                routine Medications: PNV, Ibuprofen, Iron and Percocet Condition:      stable     Postpartum Teaching: Nutrition, exercise, return to work or school, family visits, sexual activity, home rest, vaginal bleeding, pelvic rest, family planning, s/s of PPD, breast care and peri-care   Discharge to: home  Follow-up Information   Follow up with [facility]. Schedule an appointment as soon as possible for a visit in 6 weeks. (Call with any questions or concerns)    Specialty:  Obstetrics and Gynecology   Contact information:   [address] [place] [address] [phone]       Valerye Kobus, CNM, MSN 07/26/2014. 9:12 AM   Postpartum Care After Vaginal Delivery  After you deliver your newborn (postpartum period), the usual stay in the hospital is 24 72 hours. If there were problems with your labor or delivery, or if you have other medical problems, you might be in the hospital longer.  While you are in the hospital, you will receive help and instructions on how to care  for yourself and your newborn during the postpartum period.  While you are in the hospital:  Be sure to tell your nurses if you have pain or discomfort, as well as where you feel the pain and what makes the pain worse.  If you had an incision made near your vagina (episiotomy) or if you had some tearing during delivery, the nurses may put ice packs on your episiotomy or tear. The ice packs may help to reduce the pain and swelling.  If you are breastfeeding, you may feel uncomfortable contractions of your uterus for a couple of weeks. This is normal. The contractions help your uterus get back to normal size.  It is normal to  have some bleeding after delivery.  For the first 1 3 days after delivery, the flow is red and the amount may be similar to a period.  It is common for the flow to start and stop.  In the first few days, you may pass some small clots. Let your nurses know if you begin to pass large clots or your flow increases.  Do not  flush blood clots down the toilet before having the nurse look at them.  During the next 3 10 days after delivery, your flow should become more watery and pink or brown-tinged in color.  Ten to fourteen days after delivery, your flow should be a small amount of yellowish-white discharge.  The amount of your flow will decrease over the first few weeks after delivery. Your flow may stop in 6 8 weeks. Most women have had their flow stop by 12 weeks after delivery.  You should change your sanitary pads frequently.  Wash your hands thoroughly with soap and water for at least 20 seconds after changing pads, using the toilet, or before holding or feeding your newborn.  You should feel like you need to empty your bladder within the first 6 8 hours after delivery.  In case you become weak, lightheaded, or faint, call your nurse before you get out of bed for the first time and before you take a shower for the first time.  Within the first few days after delivery, your breasts may begin to feel tender and full. This is called engorgement. Breast tenderness usually goes away within 48 72 hours after engorgement occurs. You may also notice milk leaking from your breasts. If you are not breastfeeding, do not stimulate your breasts. Breast stimulation can make your breasts produce more milk.  Spending as much time as possible with your newborn is very important. During this time, you and your newborn can feel close and get to know each other. Having your newborn stay in your room (rooming in) will help to strengthen the bond with your newborn. It will give you time to get to know your  newborn and become comfortable caring for your newborn.  Your hormones change after delivery. Sometimes the hormone changes can temporarily cause you to feel sad or tearful. These feelings should not last more than a few days. If these feelings last longer than that, you should talk to your caregiver.  If desired, talk to your caregiver about methods of family planning or contraception.  Talk to your caregiver about immunizations. Your caregiver may want you to have the following immunizations before leaving the hospital:  Tetanus, diphtheria, and pertussis (Tdap) or tetanus and diphtheria (Td) immunization. It is very important that you and your family (including grandparents) or others caring for your newborn are up-to-date with the Tdap  or Td immunizations. The Tdap or Td immunization can help protect your newborn from getting ill.  Rubella immunization.  Varicella (chickenpox) immunization.  Influenza immunization. You should receive this annual immunization if you did not receive the immunization during your pregnancy. Document Released: 08/16/2007 Document Revised: 07/13/2012 Document Reviewed: 06/15/2012 [facility] Patient Information 2014 [place], [place].   Postpartum Depression and Baby Blues  The postpartum period begins right after the birth of a baby. During this time, there is often a great amount of joy and excitement. It is also a time of considerable changes in the life of the parent(s). Regardless of how many times a mother gives birth, each child brings new challenges and dynamics to the family. It is not unusual to have feelings of excitement accompanied by confusing shifts in moods, emotions, and thoughts. All mothers are at risk of developing postpartum depression or the "baby blues." These mood changes can occur right after giving birth, or they may occur many months after giving birth. The baby blues or postpartum depression can be mild or severe. Additionally, postpartum  depression can resolve rather quickly, or it can be a long-term condition. CAUSES Elevated hormones and their rapid decline are thought to be a main cause of postpartum depression and the baby blues. There are a number of hormones that radically change during and after pregnancy. Estrogen and progesterone usually decrease immediately after delivering your baby. The level of thyroid hormone and various cortisol steroids also rapidly drop. Other factors that play a major role in these changes include major life events and genetics.  RISK FACTORS If you have any of the following risks for the baby blues or postpartum depression, know what symptoms to watch out for during the postpartum period. Risk factors that may increase the likelihood of getting the baby blues or postpartum depression include:  Havinga personal or family history of depression.  Having depression while being pregnant.  Having premenstrual or oral contraceptive-associated mood issues.  Having exceptional life stress.  Having marital conflict.  Lacking a social support network.  Having a baby with special needs.  Having health problems such as diabetes. SYMPTOMS Baby blues symptoms include:  Brief fluctuations in mood, such as going from extreme happiness to sadness.  Decreased concentration.  Difficulty sleeping.  Crying spells, tearfulness.  Irritability.  Anxiety. Postpartum depression symptoms typically begin within the first month after giving birth. These symptoms include:  Difficulty sleeping or excessive sleepiness.  Marked weight loss.  Agitation.  Feelings of worthlessness.  Lack of interest in activity or food. Postpartum psychosis is a very concerning condition and can be dangerous. Fortunately, it is rare. Displaying any of the following symptoms is cause for immediate medical attention. Postpartum psychosis symptoms include:  Hallucinations and delusions.  Bizarre or disorganized  behavior.  Confusion or disorientation. DIAGNOSIS  A diagnosis is made by an evaluation of your symptoms. There are no medical or lab tests that lead to a diagnosis, but there are various questionnaires that a caregiver may use to identify those with the baby blues, postpartum depression, or psychosis. Often times, a screening tool called the [place] Postnatal Depression Scale is used to diagnose depression in the postpartum period.  TREATMENT The baby blues usually goes away on its own in 1 to 2 weeks. Social support is often all that is needed. You should be encouraged to get adequate sleep and rest. Occasionally, you may be given medicines to help you sleep.  Postpartum depression requires treatment as it can last several  months or longer if it is not treated. Treatment may include individual or group therapy, medicine, or both to address any social, physiological, and psychological factors that may play a role in the depression. Regular exercise, a healthy diet, rest, and social support may also be strongly recommended.  Postpartum psychosis is more serious and needs treatment right away. Hospitalization is often needed. HOME CARE INSTRUCTIONS  Get as much rest as you can. Nap when the baby sleeps.  Exercise regularly. Some women find yoga and walking to be beneficial.  Eat a balanced and nourishing diet.  Do little things that you enjoy. Have a cup of tea, take a bubble bath, read your favorite magazine, or listen to your favorite music.  Avoid alcohol.  Ask for help with household chores, cooking, grocery shopping, or running errands as needed. Do not try to do everything.  Talk to people close to you about how you are feeling. Get support from your partner, family members, friends, or other new moms.  Try to stay positive in how you think. Think about the things you are grateful for.  Do not spend a lot of time alone.  Only take medicine as directed by your caregiver.  Keep  all your postpartum appointments.  Let your caregiver know if you have any concerns. SEEK MEDICAL CARE IF: You are having a reaction or problems with your medicine. SEEK IMMEDIATE MEDICAL CARE IF:  You have suicidal feelings.  You feel you may harm the baby or someone else. Document Released: 07/23/2004 Document Revised: 01/11/2012 Document Reviewed: 08/25/2011 [facility] Patient Information 2014 [place], [place].

## 2014-07-26 NOTE — Discharge Instructions (Signed)
Breastfeeding Deciding to breastfeed is one of the best choices you can make for you and your baby. A change in hormones during pregnancy causes your breast tissue to grow and increases the number and size of your milk ducts. These hormones also allow proteins, sugars, and fats from your blood supply to make breast milk in your milk-producing glands. Hormones prevent breast milk from being released before your baby is born as well as prompt milk flow after birth. Once breastfeeding has begun, thoughts of your baby, as well as his or her sucking or crying, can stimulate the release of milk from your milk-producing glands.  BENEFITS OF BREASTFEEDING For Your Baby  Your first milk (colostrum) helps your baby's digestive system function better.   There are antibodies in your milk that help your baby fight off infections.   Your baby has a lower incidence of asthma, allergies, and sudden infant death syndrome.   The nutrients in breast milk are better for your baby than infant formulas and are designed uniquely for your baby's needs.   Breast milk improves your baby's brain development.   Your baby is less likely to develop other conditions, such as childhood obesity, asthma, or type 2 diabetes mellitus.  For You   Breastfeeding helps to create a very special bond between you and your baby.   Breastfeeding is convenient. Breast milk is always available at the correct temperature and costs nothing.   Breastfeeding helps to burn calories and helps you lose the weight gained during pregnancy.   Breastfeeding makes your uterus contract to its prepregnancy size faster and slows bleeding (lochia) after you give birth.   Breastfeeding helps to lower your risk of developing type 2 diabetes mellitus, osteoporosis, and breast or ovarian cancer later in life. SIGNS THAT YOUR BABY IS HUNGRY Early Signs of Hunger  Increased alertness or activity.  Stretching.  Movement of the head from  side to side.  Movement of the head and opening of the mouth when the corner of the mouth or cheek is stroked (rooting).  Increased sucking sounds, smacking lips, cooing, sighing, or squeaking.  Hand-to-mouth movements.  Increased sucking of fingers or hands. Late Signs of Hunger  Fussing.  Intermittent crying. Extreme Signs of Hunger Signs of extreme hunger will require calming and consoling before your baby will be able to breastfeed successfully. Do not wait for the following signs of extreme hunger to occur before you initiate breastfeeding:   Restlessness.  A loud, strong cry.   Screaming. BREASTFEEDING BASICS Breastfeeding Initiation  Find a comfortable place to sit or lie down, with your neck and back well supported.  Place a pillow or rolled up blanket under your baby to bring him or her to the level of your breast (if you are seated). Nursing pillows are specially designed to help support your arms and your baby while you breastfeed.  Make sure that your baby's abdomen is facing your abdomen.   Gently massage your breast. With your fingertips, massage from your chest wall toward your nipple in a circular motion. This encourages milk flow. You may need to continue this action during the feeding if your milk flows slowly.  Support your breast with 4 fingers underneath and your thumb above your nipple. Make sure your fingers are well away from your nipple and your baby's mouth.   Stroke your baby's lips gently with your finger or nipple.   When your baby's mouth is open wide enough, quickly bring your baby to your   breast, placing your entire nipple and as much of the colored area around your nipple (areola) as possible into your baby's mouth.   More areola should be visible above your baby's upper lip than below the lower lip.   Your baby's tongue should be between his or her lower gum and your breast.   Ensure that your baby's mouth is correctly positioned  around your nipple (latched). Your baby's lips should create a seal on your breast and be turned out (everted).  It is common for your baby to suck about 2-3 minutes in order to start the flow of breast milk. Latching Teaching your baby how to latch on to your breast properly is very important. An improper latch can cause nipple pain and decreased milk supply for you and poor weight gain in your baby. Also, if your baby is not latched onto your nipple properly, he or she may swallow some air during feeding. This can make your baby fussy. Burping your baby when you switch breasts during the feeding can help to get rid of the air. However, teaching your baby to latch on properly is still the best way to prevent fussiness from swallowing air while breastfeeding. Signs that your baby has successfully latched on to your nipple:    Silent tugging or silent sucking, without causing you pain.   Swallowing heard between every 3-4 sucks.    Muscle movement above and in front of his or her ears while sucking.  Signs that your baby has not successfully latched on to nipple:   Sucking sounds or smacking sounds from your baby while breastfeeding.  Nipple pain. If you think your baby has not latched on correctly, slip your finger into the corner of your baby's mouth to break the suction and place it between your baby's gums. Attempt breastfeeding initiation again. Signs of Successful Breastfeeding Signs from your baby:   A gradual decrease in the number of sucks or complete cessation of sucking.   Falling asleep.   Relaxation of his or her body.   Retention of a small amount of milk in his or her mouth.   Letting go of your breast by himself or herself. Signs from you:  Breasts that have increased in firmness, weight, and size 1-3 hours after feeding.   Breasts that are softer immediately after breastfeeding.  Increased milk volume, as well as a change in milk consistency and color by  the fifth day of breastfeeding.   Nipples that are not sore, cracked, or bleeding. Signs That Your Baby is Getting Enough Milk  Wetting at least 3 diapers in a 24-hour period. The urine should be clear and pale yellow by age 5 days.  At least 3 stools in a 24-hour period by age 5 days. The stool should be soft and yellow.  At least 3 stools in a 24-hour period by age 7 days. The stool should be seedy and yellow.  No loss of weight greater than 10% of birth weight during the first 3 days of age.  Average weight gain of 4-7 ounces (113-198 g) per week after age 4 days.  Consistent daily weight gain by age 5 days, without weight loss after the age of 2 weeks. After a feeding, your baby may spit up a small amount. This is common. BREASTFEEDING FREQUENCY AND DURATION Frequent feeding will help you make more milk and can prevent sore nipples and breast engorgement. Breastfeed when you feel the need to reduce the fullness of your breasts   or when your baby shows signs of hunger. This is called "breastfeeding on demand." Avoid introducing a pacifier to your baby while you are working to establish breastfeeding (the first 4-6 weeks after your baby is born). After this time you may choose to use a pacifier. Research has shown that pacifier use during the first year of a baby's life decreases the risk of sudden infant death syndrome (SIDS). Allow your baby to feed on each breast as long as he or she wants. Breastfeed until your baby is finished feeding. When your baby unlatches or falls asleep while feeding from the first breast, offer the second breast. Because newborns are often sleepy in the first few weeks of life, you may need to awaken your baby to get him or her to feed. Breastfeeding times will vary from baby to baby. However, the following rules can serve as a guide to help you ensure that your baby is properly fed:  Newborns (babies 4 weeks of age or younger) may breastfeed every 1-3  hours.  Newborns should not go longer than 3 hours during the day or 5 hours during the night without breastfeeding.  You should breastfeed your baby a minimum of 8 times in a 24-hour period until you begin to introduce solid foods to your baby at around 6 months of age. BREAST MILK PUMPING Pumping and storing breast milk allows you to ensure that your baby is exclusively fed your breast milk, even at times when you are unable to breastfeed. This is especially important if you are going back to work while you are still breastfeeding or when you are not able to be present during feedings. Your lactation consultant can give you guidelines on how long it is safe to store breast milk.  A breast pump is a machine that allows you to pump milk from your breast into a sterile bottle. The pumped breast milk can then be stored in a refrigerator or freezer. Some breast pumps are operated by hand, while others use electricity. Ask your lactation consultant which type will work best for you. Breast pumps can be purchased, but some hospitals and breastfeeding support groups lease breast pumps on a monthly basis. A lactation consultant can teach you how to hand express breast milk, if you prefer not to use a pump.  CARING FOR YOUR BREASTS WHILE YOU BREASTFEED Nipples can become dry, cracked, and sore while breastfeeding. The following recommendations can help keep your breasts moisturized and healthy:  Avoid using soap on your nipples.   Wear a supportive bra. Although not required, special nursing bras and tank tops are designed to allow access to your breasts for breastfeeding without taking off your entire bra or top. Avoid wearing underwire-style bras or extremely tight bras.  Air dry your nipples for 3-4minutes after each feeding.   Use only cotton bra pads to absorb leaked breast milk. Leaking of breast milk between feedings is normal.   Use lanolin on your nipples after breastfeeding. Lanolin helps to  maintain your skin's normal moisture barrier. If you use pure lanolin, you do not need to wash it off before feeding your baby again. Pure lanolin is not toxic to your baby. You may also hand express a few drops of breast milk and gently massage that milk into your nipples and allow the milk to air dry. In the first few weeks after giving birth, some women experience extremely full breasts (engorgement). Engorgement can make your breasts feel heavy, warm, and tender to the   touch. Engorgement peaks within 3-5 days after you give birth. The following recommendations can help ease engorgement:  Completely empty your breasts while breastfeeding or pumping. You may want to start by applying warm, moist heat (in the shower or with warm water-soaked hand towels) just before feeding or pumping. This increases circulation and helps the milk flow. If your baby does not completely empty your breasts while breastfeeding, pump any extra milk after he or she is finished.  Wear a snug bra (nursing or regular) or tank top for 1-2 days to signal your body to slightly decrease milk production.  Apply ice packs to your breasts, unless this is too uncomfortable for you.  Make sure that your baby is latched on and positioned properly while breastfeeding. If engorgement persists after 48 hours of following these recommendations, contact your health care provider or a lactation consultant. OVERALL HEALTH CARE RECOMMENDATIONS WHILE BREASTFEEDING  Eat healthy foods. Alternate between meals and snacks, eating 3 of each per day. Because what you eat affects your breast milk, some of the foods may make your baby more irritable than usual. Avoid eating these foods if you are sure that they are negatively affecting your baby.  Drink milk, fruit juice, and water to satisfy your thirst (about 10 glasses a day).   Rest often, relax, and continue to take your prenatal vitamins to prevent fatigue, stress, and anemia.  Continue  breast self-awareness checks.  Avoid chewing and smoking tobacco.  Avoid alcohol and drug use. Some medicines that may be harmful to your baby can pass through breast milk. It is important to ask your health care provider before taking any medicine, including all over-the-counter and prescription medicine as well as vitamin and herbal supplements. It is possible to become pregnant while breastfeeding. If birth control is desired, ask your health care provider about options that will be safe for your baby. SEEK MEDICAL CARE IF:   You feel like you want to stop breastfeeding or have become frustrated with breastfeeding.  You have painful breasts or nipples.  Your nipples are cracked or bleeding.  Your breasts are red, tender, or warm.  You have a swollen area on either breast.  You have a fever or chills.  You have nausea or vomiting.  You have drainage other than breast milk from your nipples.  Your breasts do not become full before feedings by the fifth day after you give birth.  You feel sad and depressed.  Your baby is too sleepy to eat well.  Your baby is having trouble sleeping.   Your baby is wetting less than 3 diapers in a 24-hour period.  Your baby has less than 3 stools in a 24-hour period.  Your baby's skin or the white part of his or her eyes becomes yellow.   Your baby is not gaining weight by 5 days of age. SEEK IMMEDIATE MEDICAL CARE IF:   Your baby is overly tired (lethargic) and does not want to wake up and feed.  Your baby develops an unexplained fever. Document Released: 10/19/2005 Document Revised: 10/24/2013 Document Reviewed: 04/12/2013 Exit[employer] Patient Information 2015 [employer]. This information is not intended to replace advice given to you by your health care provider. Make sure you discuss any questions you have with your health care provider.  

## 2014-07-26 NOTE — Progress Notes (Signed)
UR chart review completed.  

## 2014-08-01 ENCOUNTER — Ambulatory Visit (HOSPITAL_COMMUNITY): Admit: 2014-08-01 | Payer: Medicaid Other

## 2014-09-03 ENCOUNTER — Encounter (HOSPITAL_COMMUNITY): Payer: Self-pay | Admitting: General Practice

## 2017-02-11 DIAGNOSIS — Z3A01 Less than 8 weeks gestation of pregnancy: Secondary | ICD-10-CM | POA: Diagnosis not present

## 2017-02-11 DIAGNOSIS — O30049 Twin pregnancy, dichorionic/diamniotic, unspecified trimester: Secondary | ICD-10-CM | POA: Diagnosis not present

## 2017-02-11 DIAGNOSIS — Z529 Donor of unspecified organ or tissue: Secondary | ICD-10-CM | POA: Diagnosis not present

## 2017-03-04 DIAGNOSIS — O09511 Supervision of elderly primigravida, first trimester: Secondary | ICD-10-CM | POA: Diagnosis not present

## 2017-03-04 DIAGNOSIS — Z3A10 10 weeks gestation of pregnancy: Secondary | ICD-10-CM | POA: Diagnosis not present

## 2017-03-04 DIAGNOSIS — O3680X0 Pregnancy with inconclusive fetal viability, not applicable or unspecified: Secondary | ICD-10-CM | POA: Diagnosis not present

## 2017-03-04 DIAGNOSIS — O30049 Twin pregnancy, dichorionic/diamniotic, unspecified trimester: Secondary | ICD-10-CM | POA: Diagnosis not present

## 2017-03-18 DIAGNOSIS — Z3A12 12 weeks gestation of pregnancy: Secondary | ICD-10-CM | POA: Diagnosis not present

## 2017-03-18 DIAGNOSIS — Z3491 Encounter for supervision of normal pregnancy, unspecified, first trimester: Secondary | ICD-10-CM | POA: Diagnosis not present

## 2017-03-18 DIAGNOSIS — O3680X0 Pregnancy with inconclusive fetal viability, not applicable or unspecified: Secondary | ICD-10-CM | POA: Diagnosis not present

## 2017-03-18 DIAGNOSIS — Z3A11 11 weeks gestation of pregnancy: Secondary | ICD-10-CM | POA: Diagnosis not present

## 2017-03-18 DIAGNOSIS — Z3481 Encounter for supervision of other normal pregnancy, first trimester: Secondary | ICD-10-CM | POA: Diagnosis not present

## 2017-03-23 DIAGNOSIS — Z3491 Encounter for supervision of normal pregnancy, unspecified, first trimester: Secondary | ICD-10-CM | POA: Diagnosis not present

## 2017-03-23 DIAGNOSIS — Z36 Encounter for antenatal screening for chromosomal anomalies: Secondary | ICD-10-CM | POA: Diagnosis not present

## 2017-03-23 DIAGNOSIS — Z3A12 12 weeks gestation of pregnancy: Secondary | ICD-10-CM | POA: Diagnosis not present

## 2017-03-23 DIAGNOSIS — Z3682 Encounter for antenatal screening for nuchal translucency: Secondary | ICD-10-CM | POA: Diagnosis not present

## 2017-05-10 DIAGNOSIS — Z3A19 19 weeks gestation of pregnancy: Secondary | ICD-10-CM | POA: Diagnosis not present

## 2017-05-10 DIAGNOSIS — Z3689 Encounter for other specified antenatal screening: Secondary | ICD-10-CM | POA: Diagnosis not present

## 2017-05-10 DIAGNOSIS — Z3482 Encounter for supervision of other normal pregnancy, second trimester: Secondary | ICD-10-CM | POA: Diagnosis not present

## 2017-06-08 DIAGNOSIS — Z3482 Encounter for supervision of other normal pregnancy, second trimester: Secondary | ICD-10-CM | POA: Diagnosis not present

## 2017-06-08 DIAGNOSIS — Z3A23 23 weeks gestation of pregnancy: Secondary | ICD-10-CM | POA: Diagnosis not present

## 2017-07-01 DIAGNOSIS — Z3689 Encounter for other specified antenatal screening: Secondary | ICD-10-CM | POA: Diagnosis not present

## 2017-07-01 DIAGNOSIS — Z23 Encounter for immunization: Secondary | ICD-10-CM | POA: Diagnosis not present

## 2017-07-01 DIAGNOSIS — Z3A26 26 weeks gestation of pregnancy: Secondary | ICD-10-CM | POA: Diagnosis not present

## 2017-07-01 DIAGNOSIS — O30042 Twin pregnancy, dichorionic/diamniotic, second trimester: Secondary | ICD-10-CM | POA: Diagnosis not present

## 2017-07-14 DIAGNOSIS — Z3A28 28 weeks gestation of pregnancy: Secondary | ICD-10-CM | POA: Diagnosis not present

## 2017-07-14 DIAGNOSIS — Z3492 Encounter for supervision of normal pregnancy, unspecified, second trimester: Secondary | ICD-10-CM | POA: Diagnosis not present

## 2017-07-22 DIAGNOSIS — D485 Neoplasm of uncertain behavior of skin: Secondary | ICD-10-CM | POA: Diagnosis not present

## 2017-07-22 DIAGNOSIS — D1801 Hemangioma of skin and subcutaneous tissue: Secondary | ICD-10-CM | POA: Diagnosis not present

## 2017-07-22 DIAGNOSIS — L821 Other seborrheic keratosis: Secondary | ICD-10-CM | POA: Diagnosis not present

## 2017-07-22 DIAGNOSIS — B078 Other viral warts: Secondary | ICD-10-CM | POA: Diagnosis not present

## 2017-07-23 DIAGNOSIS — O99810 Abnormal glucose complicating pregnancy: Secondary | ICD-10-CM | POA: Diagnosis not present

## 2017-07-26 DIAGNOSIS — O09219 Supervision of pregnancy with history of pre-term labor, unspecified trimester: Secondary | ICD-10-CM | POA: Diagnosis not present

## 2017-07-26 DIAGNOSIS — Z3403 Encounter for supervision of normal first pregnancy, third trimester: Secondary | ICD-10-CM | POA: Diagnosis not present

## 2017-07-26 DIAGNOSIS — O26879 Cervical shortening, unspecified trimester: Secondary | ICD-10-CM | POA: Diagnosis not present

## 2017-07-26 DIAGNOSIS — Z3A30 30 weeks gestation of pregnancy: Secondary | ICD-10-CM | POA: Diagnosis not present

## 2017-07-26 DIAGNOSIS — O30043 Twin pregnancy, dichorionic/diamniotic, third trimester: Secondary | ICD-10-CM | POA: Diagnosis not present

## 2017-07-26 DIAGNOSIS — O0993 Supervision of high risk pregnancy, unspecified, third trimester: Secondary | ICD-10-CM | POA: Diagnosis not present

## 2017-08-04 ENCOUNTER — Encounter (HOSPITAL_COMMUNITY): Payer: Self-pay | Admitting: *Deleted

## 2017-08-04 ENCOUNTER — Inpatient Hospital Stay (HOSPITAL_COMMUNITY)
Admission: AD | Admit: 2017-08-04 | Discharge: 2017-08-04 | Disposition: A | Discharge status: Home / Self Care | Payer: BLUE CROSS/BLUE SHIELD | Source: Ambulatory Visit | Attending: Obstetrics and Gynecology | Admitting: Obstetrics and Gynecology

## 2017-08-04 DIAGNOSIS — Z3A33 33 weeks gestation of pregnancy: Secondary | ICD-10-CM | POA: Diagnosis not present

## 2017-08-04 DIAGNOSIS — O26879 Cervical shortening, unspecified trimester: Secondary | ICD-10-CM | POA: Diagnosis not present

## 2017-08-04 DIAGNOSIS — O30043 Twin pregnancy, dichorionic/diamniotic, third trimester: Secondary | ICD-10-CM | POA: Diagnosis not present

## 2017-08-04 DIAGNOSIS — Z3A31 31 weeks gestation of pregnancy: Secondary | ICD-10-CM | POA: Diagnosis not present

## 2017-08-04 DIAGNOSIS — O4703 False labor before 37 completed weeks of gestation, third trimester: Secondary | ICD-10-CM | POA: Insufficient documentation

## 2017-08-04 DIAGNOSIS — Z3493 Encounter for supervision of normal pregnancy, unspecified, third trimester: Secondary | ICD-10-CM | POA: Diagnosis not present

## 2017-08-04 MED ORDER — BETAMETHASONE SOD PHOS & ACET 6 (3-3) MG/ML IJ SUSP
12.0000 mg | Freq: Once | INTRAMUSCULAR | Status: AC
  Administered 2017-08-04: 12 mg via INTRAMUSCULAR
  Filled 2017-08-04: qty 2

## 2017-08-04 NOTE — MAU Note (Signed)
Twin gest, sent in for Betamethasone due to increased risk of preterm delivery.  No complaints at this time

## 2017-08-05 ENCOUNTER — Inpatient Hospital Stay (HOSPITAL_COMMUNITY)
Admission: AD | Admit: 2017-08-05 | Discharge: 2017-08-05 | Disposition: A | Discharge status: Home / Self Care | Payer: BLUE CROSS/BLUE SHIELD | Source: Ambulatory Visit | Attending: Obstetrics and Gynecology | Admitting: Obstetrics and Gynecology

## 2017-08-05 DIAGNOSIS — O30043 Twin pregnancy, dichorionic/diamniotic, third trimester: Secondary | ICD-10-CM | POA: Insufficient documentation

## 2017-08-05 DIAGNOSIS — Z3A33 33 weeks gestation of pregnancy: Secondary | ICD-10-CM | POA: Diagnosis not present

## 2017-08-05 DIAGNOSIS — O4703 False labor before 37 completed weeks of gestation, third trimester: Secondary | ICD-10-CM | POA: Insufficient documentation

## 2017-08-05 MED ORDER — BETAMETHASONE SOD PHOS & ACET 6 (3-3) MG/ML IJ SUSP
12.0000 mg | Freq: Once | INTRAMUSCULAR | Status: AC
  Administered 2017-08-05: 12 mg via INTRAMUSCULAR
  Filled 2017-08-05: qty 2

## 2017-08-05 NOTE — MAU Note (Signed)
Patient here for 2nd BMZ shot. Denies any complaints.

## 2017-08-10 DIAGNOSIS — Z23 Encounter for immunization: Secondary | ICD-10-CM | POA: Diagnosis not present

## 2017-08-17 ENCOUNTER — Inpatient Hospital Stay (HOSPITAL_COMMUNITY): Payer: BLUE CROSS/BLUE SHIELD

## 2017-08-17 ENCOUNTER — Inpatient Hospital Stay (HOSPITAL_COMMUNITY)
Admission: AD | Admit: 2017-08-17 | Discharge: 2017-08-17 | Disposition: A | Discharge status: Home / Self Care | Payer: BLUE CROSS/BLUE SHIELD | Source: Ambulatory Visit | Attending: Obstetrics and Gynecology | Admitting: Obstetrics and Gynecology

## 2017-08-17 ENCOUNTER — Encounter (HOSPITAL_COMMUNITY): Payer: Self-pay

## 2017-08-17 DIAGNOSIS — O30003 Twin pregnancy, unspecified number of placenta and unspecified number of amniotic sacs, third trimester: Secondary | ICD-10-CM

## 2017-08-17 DIAGNOSIS — Z363 Encounter for antenatal screening for malformations: Secondary | ICD-10-CM | POA: Diagnosis not present

## 2017-08-17 DIAGNOSIS — O26853 Spotting complicating pregnancy, third trimester: Secondary | ICD-10-CM | POA: Diagnosis not present

## 2017-08-17 DIAGNOSIS — O479 False labor, unspecified: Secondary | ICD-10-CM | POA: Diagnosis not present

## 2017-08-17 DIAGNOSIS — Z3A33 33 weeks gestation of pregnancy: Secondary | ICD-10-CM | POA: Insufficient documentation

## 2017-08-17 DIAGNOSIS — O4700 False labor before 37 completed weeks of gestation, unspecified trimester: Secondary | ICD-10-CM

## 2017-08-17 DIAGNOSIS — O26873 Cervical shortening, third trimester: Secondary | ICD-10-CM | POA: Diagnosis not present

## 2017-08-17 DIAGNOSIS — O30043 Twin pregnancy, dichorionic/diamniotic, third trimester: Secondary | ICD-10-CM | POA: Insufficient documentation

## 2017-08-17 DIAGNOSIS — O4693 Antepartum hemorrhage, unspecified, third trimester: Secondary | ICD-10-CM

## 2017-08-17 DIAGNOSIS — O328XX2 Maternal care for other malpresentation of fetus, fetus 2: Secondary | ICD-10-CM | POA: Diagnosis not present

## 2017-08-17 DIAGNOSIS — O321XX1 Maternal care for breech presentation, fetus 1: Secondary | ICD-10-CM | POA: Diagnosis not present

## 2017-08-17 DIAGNOSIS — O3433 Maternal care for cervical incompetence, third trimester: Secondary | ICD-10-CM

## 2017-08-17 NOTE — Discharge Instructions (Signed)

## 2017-08-17 NOTE — Progress Notes (Signed)
The patient wants to go home.  Ultrasound:  Twin gestation. Fetus A is in a breech presentation. Amniotic fluid volume is normal. Growth is 46 percentile. Twin B is in a vertex presentation. Amniotic fluid volume is normal. Growth is 30th percentile.  Fetal heart rate tracing is category 12.  I explained to the patient that I cannot predict whether she will labor. She understands that if she labors, and if the presenting fetus is in a breech presentation, then we will need to do a cesarean section.  Labor precautions given.  The patient will return to the office in one week. She will call for increased contractions, bleeding, leakage of fluid, or any other complication.  Leonard Schwartz M.D. 08/17/2017 5:50 PM

## 2017-08-17 NOTE — MAU Note (Signed)
Sent from office, has been contracting last few days.   cx was 3-4 cm.  Bleeding noted while there. Has already had lung shots.

## 2017-08-17 NOTE — MAU Provider Note (Signed)
DATE: 08/17/2017  Maternity Admissions Unit History and Physical Exam for an Obstetrics Patient  [name] is a 36 y.o. female, [phone], at [redacted]w[redacted]d gestation, who presents for evaluation of possible early labor. She has been followed at the [facility] and Gynecology division of [facility].  Her pregnancy has been complicated by twin gestation. This is a surrogate pregnancy. The patient's cervix was 1 cm dilated last week. She was seen today and her cervix was 3 cm dilated. She had a small amount of bloody show. Her babies have been in a breech breech presentation. Last week the presenting baby was vertex.The patient received betamethasone on October 3 and October 4. See history below.  OB History    Gravida Para Term Preterm AB Living   SAB TAB Ectopic Multiple Live Births           3      Past Medical History:  Diagnosis Date  . Medical history non-contributory     Prescriptions Prior to Admission  Medication Sig Dispense Refill Last Dose  . ferrous sulfate (FERROUSUL) 325 (65 FE) MG tablet Take 1 tablet (325 mg total) by mouth daily with breakfast. 60 tablet 3 08/17/2017 at Unknown time  . Prenatal Vit-Fe Fumarate-FA (PRENATAL MULTIVITAMIN) TABS tablet Take 1 tablet by mouth daily at 12 noon.   08/17/2017 at Unknown time    Past Surgical History:  Procedure Laterality Date  . WISDOM TOOTH EXTRACTION      No Known Allergies  Family History: family history includes Cancer in her mother.  Social History:  reports that she has never smoked. She has never used smokeless tobacco. She reports that she does not drink alcohol or use drugs.  Review of systems: Normal pregnancy complaints.  Admission Physical Exam:  Dilation: 3 Exam by:: dr Stefano Gaul Body mass index is 26.85 kg/m.  Blood pressure 110/88, pulse (!) 109, temperature 98.6 F (37 C), temperature source Oral, resp. rate 18, weight 62.4 kg (137 lb 8 oz), SpO2 99  %, unknown if currently breastfeeding.  HEENT:                 Within normal limits Chest:                   Clear Heart:                    Regular rate and rhythm Abdomen:             Gravid and nontender Extremities:          Grossly normal Neurologic exam: Grossly normal Pelvic exam:         Cervix: 3 cm, 50% effaced, -3 station  NST: Category category 12; Contractions: mild contractions every 7-10 minutes .   Assessment:  [redacted]w[redacted]d gestation  Twin gestation  Questionable presentation  Preterm cervical change  Surrogate pregnancy  Plan:  The patient has been observed for signs of active labor. She does not appear to be laboring. We are awaiting an ultrasound to confirm presentation and for growth.  I anticipate that the patient will be discharged to home. I will recommend that the patient rest as much as possible This may be difficult because she has children at home and because her husband has a medical illness that may require surgery.   Janine Limbo 08/17/2017, 4:43 PM

## 2017-08-21 ENCOUNTER — Encounter (HOSPITAL_COMMUNITY): Payer: Self-pay | Admitting: *Deleted

## 2017-08-21 ENCOUNTER — Inpatient Hospital Stay (HOSPITAL_COMMUNITY): Payer: BLUE CROSS/BLUE SHIELD

## 2017-08-21 ENCOUNTER — Inpatient Hospital Stay (HOSPITAL_COMMUNITY)
Admission: AD | Admit: 2017-08-21 | Discharge: 2017-08-23 | DRG: 786 | Disposition: A | Discharge status: Home / Self Care | Payer: BLUE CROSS/BLUE SHIELD | Source: Ambulatory Visit | Attending: Obstetrics and Gynecology | Admitting: Obstetrics and Gynecology

## 2017-08-21 DIAGNOSIS — O30043 Twin pregnancy, dichorionic/diamniotic, third trimester: Secondary | ICD-10-CM | POA: Diagnosis present

## 2017-08-21 DIAGNOSIS — O26893 Other specified pregnancy related conditions, third trimester: Secondary | ICD-10-CM | POA: Diagnosis present

## 2017-08-21 DIAGNOSIS — O321XX1 Maternal care for breech presentation, fetus 1: Secondary | ICD-10-CM | POA: Diagnosis not present

## 2017-08-21 DIAGNOSIS — O26873 Cervical shortening, third trimester: Secondary | ICD-10-CM | POA: Diagnosis present

## 2017-08-21 DIAGNOSIS — Z6791 Unspecified blood type, Rh negative: Secondary | ICD-10-CM | POA: Diagnosis not present

## 2017-08-21 DIAGNOSIS — O321XX0 Maternal care for breech presentation, not applicable or unspecified: Secondary | ICD-10-CM | POA: Diagnosis not present

## 2017-08-21 DIAGNOSIS — Z3689 Encounter for other specified antenatal screening: Secondary | ICD-10-CM | POA: Diagnosis not present

## 2017-08-21 DIAGNOSIS — O30009 Twin pregnancy, unspecified number of placenta and unspecified number of amniotic sacs, unspecified trimester: Secondary | ICD-10-CM | POA: Diagnosis not present

## 2017-08-21 DIAGNOSIS — Z3A34 34 weeks gestation of pregnancy: Secondary | ICD-10-CM

## 2017-08-21 DIAGNOSIS — O328XX2 Maternal care for other malpresentation of fetus, fetus 2: Secondary | ICD-10-CM | POA: Diagnosis present

## 2017-08-21 DIAGNOSIS — Z3A00 Weeks of gestation of pregnancy not specified: Secondary | ICD-10-CM | POA: Diagnosis not present

## 2017-08-21 DIAGNOSIS — O4703 False labor before 37 completed weeks of gestation, third trimester: Secondary | ICD-10-CM | POA: Diagnosis not present

## 2017-08-21 DIAGNOSIS — O30003 Twin pregnancy, unspecified number of placenta and unspecified number of amniotic sacs, third trimester: Secondary | ICD-10-CM

## 2017-08-21 MED ORDER — SOD CITRATE-CITRIC ACID 500-334 MG/5ML PO SOLN
30.0000 mL | ORAL | Status: AC
  Administered 2017-08-22: 30 mL via ORAL
  Filled 2017-08-21: qty 15

## 2017-08-21 MED ORDER — CEFAZOLIN SODIUM-DEXTROSE 2-4 GM/100ML-% IV SOLN
2.0000 g | INTRAVENOUS | Status: AC
  Administered 2017-08-22: 2 g via INTRAVENOUS
  Filled 2017-08-21: qty 100

## 2017-08-21 NOTE — MAU Note (Signed)
Urine sent to lab 

## 2017-08-21 NOTE — H&P (Signed)
Anne Cline is a 36 y.o. female presenting for vaginal bleeding and contractions.  Patient states she noted bleeding about one hour prior to arrival while going to the restroom.  Patient states contractions started soon after and have been q485min and progressively worsening.  Patient is under the care of [facility] and pregnancy history significant for IVF for surrogacy, Rh negative, AMA, Twin Gestation, and Short Cervix.  GBS Unknown and patient s/p BMZ dosing on 10/3 & 10/4.  US reveals breech/breech presentation and twins 4lbs 12oz and 4lbs 4oz on 10/16. Of note, patient's husband had open heart surgery yesterday morning.  Although doing well, patient is appropriately stressed regarding this situation.   OB History    Gravida Para Term Preterm AB Living   4 3 3     3    SAB TAB Ectopic Multiple Live Births           3     Past Medical History:  Diagnosis Date  . Medical history non-contributory    Past Surgical History:  Procedure Laterality Date  . WISDOM TOOTH EXTRACTION     Family History: family history includes Cancer in her mother. Social History:  reports that she has never smoked. She has never used smokeless tobacco. She reports that she does not drink alcohol or use drugs.     Maternal Diabetes: No Genetic Screening: Declined Maternal Ultrasounds/Referrals: Normal Fetal Ultrasounds or other Referrals:  None Maternal Substance Abuse:  No Significant Maternal Medications:  Meds include: Other: Rhophylac Significant Maternal Lab Results:  Lab values include: Rh negative, Other: GBS Unknown Other Comments:  None  Review of Systems  Constitutional: Negative for chills and fever.  Gastrointestinal: Positive for abdominal pain. Negative for constipation, diarrhea, nausea and vomiting.  Genitourinary: Negative for dysuria.  Neurological: Negative for headaches.   Maternal Medical History:  Reason for admission: Nausea.    Dilation: 5 Effacement (%): 90 Station: -1 Exam  by:: Nereyda Bowler, cnm Pulse 95, temperature 98.2 F (36.8 C), resp. rate 20, height 5\' 1"  (1.549 m), weight 64.4 kg (142 lb), unknown if currently breastfeeding. Exam Physical Exam  Constitutional: She is oriented to person, place, and time. She appears well-developed and well-nourished. She appears distressed.  HENT:  Head: Normocephalic and atraumatic.  Eyes: Conjunctivae are normal.  Neck: Normal range of motion.  Cardiovascular: Normal rate, regular rhythm and normal heart sounds.   Respiratory: Effort normal and breath sounds normal.  GI: Soft. Bowel sounds are normal.  Gravid--fundal height appears LGA, Soft RT, NT  Genitourinary: Uterus is enlarged. Cervix exhibits no motion tenderness. There is bleeding in the vagina.  Genitourinary Comments: Dilation: 5 Effacement (%): 90 Station: -1 Exam by:: Gerrit HeckJessica Jermani Pund, cnm  Sterile Speculum Exam: -Vaginal Vault: Small amt blood -Cervix: Appears open, scant amt blood tinged mucous from os    Musculoskeletal: Normal range of motion. She exhibits edema (Non pitting).  Neurological: She is alert and oriented to person, place, and time.  Skin: Skin is warm and dry.  Psychiatric: Her speech is normal and behavior is normal. Thought content normal. Her affect is labile.    Prenatal labs: ABO, Rh:  A Negative Antibody:  Negative Rubella:  Immune RPR:   NR HBsAg:   NR HIV:   NR GBS:   Unknown  Assessment/Plan: TIUP at 34.2wks Cat I FT x 2 Active Labor Breech/Breech presentation Stress  [facility] per consult with Dr. Dorris CarnesN. Dillard Routine Admission Orders per [facility] Protocol Routine NonElective Unscheduled  C/S Orders In room to complete assessment and discuss POC: Will plan for primary c/s Dr.ND en route      Cherre Robins 08/21/2017, 11:51 PM

## 2017-08-21 NOTE — MAU Note (Signed)
Saw blood on tissue when went to bathroom. Having ctxs. Twins. Pt's husband had open heart surgery yesterday for valve replacement.

## 2017-08-22 ENCOUNTER — Encounter (HOSPITAL_COMMUNITY): Payer: Self-pay | Admitting: *Deleted

## 2017-08-22 ENCOUNTER — Inpatient Hospital Stay (HOSPITAL_COMMUNITY): Payer: BLUE CROSS/BLUE SHIELD | Admitting: Anesthesiology

## 2017-08-22 ENCOUNTER — Encounter (HOSPITAL_COMMUNITY)
Admission: AD | Disposition: A | Discharge status: Home / Self Care | Payer: Self-pay | Source: Ambulatory Visit | Attending: Obstetrics and Gynecology

## 2017-08-22 DIAGNOSIS — O328XX2 Maternal care for other malpresentation of fetus, fetus 2: Secondary | ICD-10-CM | POA: Diagnosis not present

## 2017-08-22 DIAGNOSIS — O26873 Cervical shortening, third trimester: Secondary | ICD-10-CM | POA: Diagnosis present

## 2017-08-22 DIAGNOSIS — Z6791 Unspecified blood type, Rh negative: Secondary | ICD-10-CM | POA: Diagnosis not present

## 2017-08-22 DIAGNOSIS — Z3A00 Weeks of gestation of pregnancy not specified: Secondary | ICD-10-CM | POA: Diagnosis not present

## 2017-08-22 DIAGNOSIS — O30043 Twin pregnancy, dichorionic/diamniotic, third trimester: Secondary | ICD-10-CM | POA: Diagnosis present

## 2017-08-22 DIAGNOSIS — O30009 Twin pregnancy, unspecified number of placenta and unspecified number of amniotic sacs, unspecified trimester: Secondary | ICD-10-CM | POA: Diagnosis not present

## 2017-08-22 DIAGNOSIS — O321XX0 Maternal care for breech presentation, not applicable or unspecified: Secondary | ICD-10-CM | POA: Diagnosis not present

## 2017-08-22 DIAGNOSIS — O321XX1 Maternal care for breech presentation, fetus 1: Secondary | ICD-10-CM | POA: Diagnosis present

## 2017-08-22 DIAGNOSIS — Z3A34 34 weeks gestation of pregnancy: Secondary | ICD-10-CM | POA: Diagnosis not present

## 2017-08-22 DIAGNOSIS — O26893 Other specified pregnancy related conditions, third trimester: Secondary | ICD-10-CM | POA: Diagnosis present

## 2017-08-22 LAB — CBC
HCT: 34 % — ABNORMAL LOW (ref 36.0–46.0)
HCT: 38 % (ref 36.0–46.0)
Hemoglobin: 11.3 g/dL — ABNORMAL LOW (ref 12.0–15.0)
Hemoglobin: 12.5 g/dL (ref 12.0–15.0)
MCH: 30.1 pg (ref 26.0–34.0)
MCH: 30.5 pg (ref 26.0–34.0)
MCHC: 32.9 g/dL (ref 30.0–36.0)
MCHC: 33.2 g/dL (ref 30.0–36.0)
MCV: 91.6 fL (ref 78.0–100.0)
MCV: 91.6 fL (ref 78.0–100.0)
Platelets: 143 10*3/uL — ABNORMAL LOW (ref 150–400)
Platelets: 162 10*3/uL (ref 150–400)
RBC: 3.71 MIL/uL — ABNORMAL LOW (ref 3.87–5.11)
RBC: 4.15 MIL/uL (ref 3.87–5.11)
RDW: 16.9 % — ABNORMAL HIGH (ref 11.5–15.5)
RDW: 16.9 % — ABNORMAL HIGH (ref 11.5–15.5)
WBC: 10.7 10*3/uL — ABNORMAL HIGH (ref 4.0–10.5)
WBC: 16.7 10*3/uL — ABNORMAL HIGH (ref 4.0–10.5)

## 2017-08-22 LAB — RPR: RPR Ser Ql: NONREACTIVE

## 2017-08-22 SURGERY — Surgical Case
Anesthesia: Regional

## 2017-08-22 MED ORDER — BUPIVACAINE IN DEXTROSE 0.75-8.25 % IT SOLN
INTRATHECAL | Status: AC
Start: 2017-08-22 — End: 2017-08-22
  Filled 2017-08-22: qty 2

## 2017-08-22 MED ORDER — SIMETHICONE 80 MG PO CHEW
80.0000 mg | CHEWABLE_TABLET | ORAL | Status: DC | PRN
  Filled 2017-08-22: qty 1

## 2017-08-22 MED ORDER — COCONUT OIL OIL
1.0000 "application " | TOPICAL_OIL | Status: DC | PRN
  Filled 2017-08-22: qty 120

## 2017-08-22 MED ORDER — SODIUM CHLORIDE 0.9 % IR SOLN
Status: DC | PRN
  Administered 2017-08-22: 1

## 2017-08-22 MED ORDER — SCOPOLAMINE 1 MG/3DAYS TD PT72
MEDICATED_PATCH | TRANSDERMAL | Status: AC
  Filled 2017-08-22: qty 1

## 2017-08-22 MED ORDER — SENNOSIDES-DOCUSATE SODIUM 8.6-50 MG PO TABS
2.0000 | ORAL_TABLET | ORAL | Status: DC
  Administered 2017-08-22: 2 via ORAL
  Filled 2017-08-22 (×2): qty 2

## 2017-08-22 MED ORDER — ACETAMINOPHEN 325 MG PO TABS: 650.0000 mg | ORAL_TABLET | ORAL | Status: DC | PRN

## 2017-08-22 MED ORDER — MORPHINE SULFATE (PF) 0.5 MG/ML IJ SOLN
INTRAMUSCULAR | Status: AC
  Filled 2017-08-22: qty 10

## 2017-08-22 MED ORDER — PHENYLEPHRINE 8 MG IN D5W 100 ML (0.08MG/ML) PREMIX OPTIME
INJECTION | INTRAVENOUS | Status: DC | PRN
  Administered 2017-08-22: 50 ug/min via INTRAVENOUS

## 2017-08-22 MED ORDER — OXYTOCIN 10 UNIT/ML IJ SOLN
INTRAMUSCULAR | Status: AC
  Filled 2017-08-22: qty 4

## 2017-08-22 MED ORDER — DIBUCAINE 1 % RE OINT: 1.0000 "application " | TOPICAL_OINTMENT | RECTAL | Status: DC | PRN

## 2017-08-22 MED ORDER — MENTHOL 3 MG MT LOZG
1.0000 | LOZENGE | OROMUCOSAL | Status: DC | PRN
  Filled 2017-08-22: qty 9

## 2017-08-22 MED ORDER — PHENYLEPHRINE 8 MG IN D5W 100 ML (0.08MG/ML) PREMIX OPTIME
INJECTION | INTRAVENOUS | Status: AC
  Filled 2017-08-22: qty 100

## 2017-08-22 MED ORDER — DOCUSATE SODIUM 100 MG PO CAPS
100.0000 mg | ORAL_CAPSULE | Freq: Every day | ORAL | Status: DC | PRN
  Administered 2017-08-22: 100 mg via ORAL
  Filled 2017-08-22: qty 1

## 2017-08-22 MED ORDER — SIMETHICONE 80 MG PO CHEW
80.0000 mg | CHEWABLE_TABLET | ORAL | Status: DC
  Administered 2017-08-22: 80 mg via ORAL
  Filled 2017-08-22 (×2): qty 1

## 2017-08-22 MED ORDER — SIMETHICONE 80 MG PO CHEW
80.0000 mg | CHEWABLE_TABLET | Freq: Three times a day (TID) | ORAL | Status: DC
Start: 2017-08-22 — End: 2017-08-24
  Administered 2017-08-22 – 2017-08-23 (×6): 80 mg via ORAL
  Filled 2017-08-22 (×9): qty 1

## 2017-08-22 MED ORDER — DIPHENHYDRAMINE HCL 25 MG PO CAPS
25.0000 mg | ORAL_CAPSULE | Freq: Four times a day (QID) | ORAL | Status: DC | PRN
  Filled 2017-08-22: qty 1

## 2017-08-22 MED ORDER — LACTATED RINGERS IV SOLN
INTRAVENOUS | Status: DC | PRN
  Administered 2017-08-22 (×3): via INTRAVENOUS

## 2017-08-22 MED ORDER — FENTANYL CITRATE (PF) 100 MCG/2ML IJ SOLN
INTRAMUSCULAR | Status: DC | PRN
  Administered 2017-08-22: 25 ug via INTRATHECAL

## 2017-08-22 MED ORDER — OXYTOCIN 40 UNITS IN LACTATED RINGERS INFUSION - SIMPLE MED: 2.5000 [IU]/h | INTRAVENOUS | Status: AC

## 2017-08-22 MED ORDER — OXYTOCIN BOLUS FROM INFUSION: 500.0000 mL | Freq: Once | INTRAVENOUS | Status: DC

## 2017-08-22 MED ORDER — WITCH HAZEL-GLYCERIN EX PADS: 1.0000 "application " | MEDICATED_PAD | CUTANEOUS | Status: DC | PRN

## 2017-08-22 MED ORDER — LACTATED RINGERS IV SOLN: INTRAVENOUS | Status: DC

## 2017-08-22 MED ORDER — LACTATED RINGERS IV SOLN: 500.0000 mL | INTRAVENOUS | Status: DC | PRN

## 2017-08-22 MED ORDER — FENTANYL CITRATE (PF) 100 MCG/2ML IJ SOLN
INTRAMUSCULAR | Status: AC
  Filled 2017-08-22: qty 2

## 2017-08-22 MED ORDER — PRENATAL MULTIVITAMIN CH
1.0000 | ORAL_TABLET | Freq: Every day | ORAL | Status: DC
Start: 2017-08-22 — End: 2017-08-24
  Administered 2017-08-22 – 2017-08-23 (×2): 1 via ORAL
  Filled 2017-08-22 (×3): qty 1

## 2017-08-22 MED ORDER — MORPHINE SULFATE (PF) 0.5 MG/ML IJ SOLN
INTRAMUSCULAR | Status: DC | PRN
  Administered 2017-08-22: .1 mg via INTRATHECAL

## 2017-08-22 MED ORDER — ZOLPIDEM TARTRATE 5 MG PO TABS: 5.0000 mg | ORAL_TABLET | Freq: Every evening | ORAL | Status: DC | PRN

## 2017-08-22 MED ORDER — LACTATED RINGERS IV SOLN
INTRAVENOUS | Status: DC | PRN
  Administered 2017-08-22: 40 [IU] via INTRAVENOUS

## 2017-08-22 MED ORDER — ONDANSETRON HCL 4 MG/2ML IJ SOLN
INTRAMUSCULAR | Status: AC
  Filled 2017-08-22: qty 2

## 2017-08-22 MED ORDER — OXYCODONE-ACETAMINOPHEN 5-325 MG PO TABS
1.0000 | ORAL_TABLET | ORAL | Status: DC | PRN
Start: 2017-08-22 — End: 2017-08-24

## 2017-08-22 MED ORDER — TETANUS-DIPHTH-ACELL PERTUSSIS 5-2.5-18.5 LF-MCG/0.5 IM SUSP: 0.5000 mL | Freq: Once | INTRAMUSCULAR | Status: DC

## 2017-08-22 MED ORDER — OXYTOCIN 40 UNITS IN LACTATED RINGERS INFUSION - SIMPLE MED
2.5000 [IU]/h | INTRAVENOUS | Status: DC
Start: 2017-08-22 — End: 2017-08-22

## 2017-08-22 MED ORDER — RHO D IMMUNE GLOBULIN 1500 UNIT/2ML IJ SOSY
300.0000 ug | PREFILLED_SYRINGE | Freq: Once | INTRAMUSCULAR | Status: AC
  Administered 2017-08-22: 300 ug via INTRAVENOUS
  Filled 2017-08-22: qty 2

## 2017-08-22 MED ORDER — HYDROMORPHONE HCL 1 MG/ML IJ SOLN: 0.2500 mg | INTRAMUSCULAR | Status: DC | PRN

## 2017-08-22 MED ORDER — SODIUM CHLORIDE 0.9 % IR SOLN
Status: DC | PRN
  Administered 2017-08-22: 1000 mL

## 2017-08-22 MED ORDER — IBUPROFEN 600 MG PO TABS
600.0000 mg | ORAL_TABLET | Freq: Four times a day (QID) | ORAL | Status: DC
  Administered 2017-08-22 – 2017-08-23 (×7): 600 mg via ORAL
  Filled 2017-08-22 (×7): qty 1

## 2017-08-22 MED ORDER — ONDANSETRON HCL 4 MG/2ML IJ SOLN
INTRAMUSCULAR | Status: DC | PRN
  Administered 2017-08-22: 4 mg via INTRAVENOUS

## 2017-08-22 MED ORDER — SCOPOLAMINE 1 MG/3DAYS TD PT72
MEDICATED_PATCH | TRANSDERMAL | Status: DC | PRN
  Administered 2017-08-22: 1 via TRANSDERMAL

## 2017-08-22 MED ORDER — OXYCODONE-ACETAMINOPHEN 5-325 MG PO TABS: 2.0000 | ORAL_TABLET | ORAL | Status: DC | PRN

## 2017-08-22 SURGICAL SUPPLY — 38 items
APL SKNCLS STERI-STRIP NONHPOA (GAUZE/BANDAGES/DRESSINGS) ×1
BENZOIN TINCTURE PRP APPL 2/3 (GAUZE/BANDAGES/DRESSINGS) ×2 IMPLANT
CHLORAPREP W/TINT 26ML (MISCELLANEOUS) ×2 IMPLANT
CLAMP CORD UMBIL (MISCELLANEOUS) IMPLANT
CLOSURE STERI STRIP 1/2 X4 (GAUZE/BANDAGES/DRESSINGS) ×1 IMPLANT
CLOTH BEACON ORANGE TIMEOUT ST (SAFETY) ×2 IMPLANT
CONTAINER PREFILL 10% NBF 15ML (MISCELLANEOUS) IMPLANT
DRAIN JACKSON PRT FLT 10 (DRAIN) IMPLANT
DRSG OPSITE POSTOP 4X10 (GAUZE/BANDAGES/DRESSINGS) ×2 IMPLANT
ELECT REM PT RETURN 9FT ADLT (ELECTROSURGICAL) ×2
ELECTRODE REM PT RTRN 9FT ADLT (ELECTROSURGICAL) ×1 IMPLANT
EVACUATOR SILICONE 100CC (DRAIN) IMPLANT
EXTRACTOR VACUUM M CUP 4 TUBE (SUCTIONS) IMPLANT
GAUZE SPONGE 4X4 12PLY STRL LF (GAUZE/BANDAGES/DRESSINGS) ×2 IMPLANT
GLOVE BIO SURGEON STRL SZ 6.5 (GLOVE) ×2 IMPLANT
GLOVE BIOGEL PI IND STRL 7.0 (GLOVE) ×2 IMPLANT
GLOVE BIOGEL PI INDICATOR 7.0 (GLOVE) ×2
GOWN STRL REUS W/TWL LRG LVL3 (GOWN DISPOSABLE) ×4 IMPLANT
KIT ABG SYR 3ML LUER SLIP (SYRINGE) IMPLANT
NEEDLE HYPO 25X5/8 SAFETYGLIDE (NEEDLE) IMPLANT
NS IRRIG 1000ML POUR BTL (IV SOLUTION) ×2 IMPLANT
PACK C SECTION WH (CUSTOM PROCEDURE TRAY) ×2 IMPLANT
PAD ABD 7.5X8 STRL (GAUZE/BANDAGES/DRESSINGS) ×1 IMPLANT
PAD OB MATERNITY 4.3X12.25 (PERSONAL CARE ITEMS) ×2 IMPLANT
PENCIL SMOKE EVAC W/HOLSTER (ELECTROSURGICAL) ×2 IMPLANT
RTRCTR C-SECT PINK 25CM LRG (MISCELLANEOUS) ×1 IMPLANT
STRIP CLOSURE SKIN 1/2X4 (GAUZE/BANDAGES/DRESSINGS) ×2 IMPLANT
SUT CHROMIC 0 CT 1 (SUTURE) ×2 IMPLANT
SUT MNCRL AB 3-0 PS2 27 (SUTURE) ×2 IMPLANT
SUT PLAIN 2 0 (SUTURE) ×4
SUT PLAIN 2 0 XLH (SUTURE) ×2 IMPLANT
SUT PLAIN ABS 2-0 CT1 27XMFL (SUTURE) ×2 IMPLANT
SUT SILK 2 0 SH (SUTURE) IMPLANT
SUT VIC AB 0 CTX 36 (SUTURE) ×8
SUT VIC AB 0 CTX36XBRD ANBCTRL (SUTURE) ×4 IMPLANT
TAPE CLOTH SURG 4X10 WHT LF (GAUZE/BANDAGES/DRESSINGS) ×2 IMPLANT
TOWEL OR 17X24 6PK STRL BLUE (TOWEL DISPOSABLE) ×2 IMPLANT
TRAY FOLEY BAG SILVER LF 14FR (SET/KITS/TRAYS/PACK) ×2 IMPLANT

## 2017-08-22 NOTE — Progress Notes (Signed)
Anne BridgemanVicki Cline, CNM present to evaluate incision and dressing. Pressure dressing loose at the lower portions. Honeycomb dressing beneath noted to have sero-sanguinous drainage that was not absorbed into dressing. Old dressing removed aseptically and wound patted dry with sterile 4X4 gauze sponges.. Patient tolerated well. Steri-strips noted to be damp and loose as well. They were removed after donning sterile gloves. New benzoin applied and new steri-strips applied before a new honeycomb dressing was placed over the wound. Pt tolerated well. Pt instructed to try to keep the dressing as dry as possible though she is permitted to shower. No further oozing noted from site as dressing was placed or on evaluation after placement.

## 2017-08-22 NOTE — Anesthesia Postprocedure Evaluation (Signed)
Anesthesia Post Note  Patient: Anne ReamerRachel C Michalec  Procedure(s) Performed: CESAREAN SECTION (N/A )     Patient location during evaluation: Mother Baby Anesthesia Type: Spinal Level of consciousness: awake Pain management: satisfactory to patient Vital Signs Assessment: post-procedure vital signs reviewed and stable Respiratory status: spontaneous breathing Cardiovascular status: stable Anesthetic complications: no    Last Vitals:  Vitals:   08/22/17 0821 08/22/17 1156  BP: 109/71 117/75  Pulse: 68 78  Resp: 16 16  Temp: 36.9 C 36.9 C  SpO2: 99% 99%    Last Pain:  Vitals:   08/22/17 1200  TempSrc:   PainSc: 0-No pain   Pain Goal: Patients Stated Pain Goal: 3 (08/22/17 0800)               Cephus ShellingBURGER,Izaak Sahr

## 2017-08-22 NOTE — Progress Notes (Signed)
Spoke with Sabas SousJ. Emly, CNM about dressing status of patient; saturated. Ok to change dressing, and to reapply pressure dressing.

## 2017-08-22 NOTE — Plan of Care (Signed)
Problem: Pain Managment: Goal: General experience of comfort will improve Outcome: Progressing Patient instructed is use of PRN medications for pain.   Problem: Physical Regulation: Goal: Ability to maintain clinical measurements within normal limits will improve Outcome: Progressing Vital signs are stable. Patient instructed in expected course for recovery after a Cesarean Section delivery. Goal: Will remain free from infection Outcome: Progressing Vital signs stable. Patient encouraged to notify staff if she experiences fever, chills, leg cramps, chest pain or HA.

## 2017-08-22 NOTE — Progress Notes (Signed)
Anne ReamerRachel C Cline [id number]  Subjective: Postpartum Day 0: Primary LTC/S due to twins, preterm labor, breech/breech presentation, surrogate pregnancy. Patient up ad lib, reports no syncope or dizziness. Contraceptive plan:  Undecided Babies in NICU, biological parents on the way from [place].  Patient RH negative.  Twin A is B+, Twin B is B negative.  [facility] s/p open heart surgery for valve replacement--patient anticipates he will be in the hospital until at least later this week.  Patient's mother and sister are helping patient and her family.  Patient coping well with multiple stressors.  Objective: Temp:  [97.8 F (36.6 C)-98.4 F (36.9 C)] 98.3 F (36.8 C) (10/21 0324) Pulse Rate:  [60-95] 60 (10/21 0530) Resp:  [13-20] 18 (10/21 0530) BP: (100-128)/(61-92) 116/68 (10/21 0530) SpO2:  [96 %-100 %] 98 % (10/21 0530) Weight:  [64.4 kg (142 lb)] 64.4 kg (142 lb) (10/20 2303)  CBC Latest Ref Rng & Units 08/22/2017 08/21/2017 07/25/2014  WBC 4.0 - 10.5 K/uL 16.7(H) 10.7(H) 19.2(H)  Hemoglobin 12.0 - 15.0 g/dL 11.3(L) 12.5 9.9(L)  Hematocrit 36.0 - 46.0 % 34.0(L) 38.0 30.2(L)  Platelets 150 - 400 K/uL 143(L) 162 159     Physical Exam:  General: alert Lochia: appropriate Uterine Fundus: firm Abdomen:  + bowel sounds Incision: Pressure dressing with small amount of staining at lower border.  Original dressing had been changed at 0235 due to saturation. DVT Evaluation: No evidence of DVT seen on physical exam. Negative Homan's sign. Foley draining clear urine.  Assessment/Plan: Status post cesarean delivery, day 0 PTD of twins as surrogate [facility] RH negative. Stable Continue current care. Support to patient for issues and circumstances. SW consult . Rhopnyalc prior to d/c.    Anne Cline, Anne Skilton MSN, CNM 08/22/2017, 7:39 AM

## 2017-08-22 NOTE — Anesthesia Preprocedure Evaluation (Signed)
Anesthesia Evaluation  Patient identified by MRN, date of birth, ID band Patient awake    Reviewed: Allergy & Precautions, H&P , Patient's Chart, lab work & pertinent test results, reviewed documented beta blocker date and time   Airway Mallampati: II  TM Distance: >3 FB Neck ROM: full    Dental no notable dental hx.    Pulmonary    Pulmonary exam normal breath sounds clear to auscultation       Cardiovascular  Rhythm:regular Rate:Normal     Neuro/Psych    GI/Hepatic   Endo/Other    Renal/GU      Musculoskeletal   Abdominal   Peds  Hematology   Anesthesia Other Findings   Reproductive/Obstetrics                             Anesthesia Physical Anesthesia Plan  ASA: II and emergent  Anesthesia Plan: Spinal   Post-op Pain Management:    Induction:   PONV Risk Score and Plan: 2 and Ondansetron, Dexamethasone and Treatment may vary due to age or medical condition  Airway Management Planned:   Additional Equipment:   Intra-op Plan:   Post-operative Plan:   Informed Consent: I have reviewed the patients History and Physical, chart, labs and discussed the procedure including the risks, benefits and alternatives for the proposed anesthesia with the patient or authorized representative who has indicated his/her understanding and acceptance.   Dental Advisory Given  Plan Discussed with: CRNA and Surgeon  Anesthesia Plan Comments: (  )        Anesthesia Quick Evaluation

## 2017-08-22 NOTE — Op Note (Signed)
Cesarean Section Procedure Note   Anne ReamerRachel C Kontz  08/21/2017 - 08/22/2017  Indications: Breech Presentation and labor twins    Pre-operative Diagnosis: CESAREAN SECTION.   Post-operative Diagnosis: Same   Surgeon: Surgeon(s) and Role:    * Jaymes Graffillard, Emiah Pellicano, MD - Primary   Assistants: Cordie GriceJ. Emley CNM   Anesthesia: spinal   Procedure Details:  The patient was seen in the Holding Room. The risks, benefits, complications, treatment options, and expected outcomes were discussed with the patient. The patient concurred with the proposed plan, giving informed consent. identified as Anne Cline and the procedure verified as C-Section Delivery. A Time Out was held and the above information confirmed.  After induction of anesthesia, the patient was draped and prepped in the usual sterile manner. A transverse incision was made and carried down through the subcutaneous tissue to the fascia. Fascial incision was made in the midline and extended transversely. The fascia was separated from the underlying rectus muscle superiorly and inferiorly. The peritoneum was identified and entered. Peritoneal incision was extended longitudinally with good visualization of bowel and bladder. The utero-vesical peritoneal reflection was incised transversely and the bladder flap was bluntly freed from the lower uterine segment.  An alexsis retractor was placed in the abdomen.   A low transverse uterine incision was made. Delivered from breech frank and , footling breech for twin B presentation was a  infant, with Apgar scores of not recorded yet at one minute and not yet recorded at five minutes.  Using normal breech manuevers Cord ph was not sent the umbilical cord was clamped and cut cord blood was obtained for evaluation. The placenta was removed Intact and appeared normal. The uterine outline, tubes and ovaries appeared normal}. The uterine incision was closed with running locked sutures of 0Vicryl. There was a small  hematoma noted anterior to the uterine incision.   A second layer 0 vicrlyl was used to imbricate the uterine incision    Hemostasis was observed. Lavage was carried out until clear. The alexsis was removed.  The peritoneum was closed with 0 chromic.  The muscles were examined and any bleeders were made hemostatic using bovie cautery device.   The fascia was then reapproximated with running sutures of 0 vicryl.  The subcutaneous tissue was reapproximated  With interrupted stitches using 2-0 plain gut. The subcuticular closure was performed using 3-730monocryl     Instrument, sponge, and needle counts were correct prior the abdominal closure and were correct at the conclusion of the case.    Findings: infants were delivered from frank breech and footling breech presentations . The fluid was clear.  The uterus tubes and ovaries appeared normal.     Estimated Blood Loss: 711   Total IV Fluids: 1200ml   Urine Output: 425CC OF clear urine  Specimens: placentas to path  Complications: no complications  Disposition: PACU - hemodynamically stable.   Maternal Condition: stable   Baby condition / location:  NICU  Attending Attestation: I performed the procedure.   Signed: Surgeon(s): Jaymes Graffillard, Sherlyn Ebbert, MD

## 2017-08-22 NOTE — Anesthesia Procedure Notes (Signed)
Spinal  Patient location during procedure: OR Staffing Anesthesiologist: Emeril Stille Spinal Block Patient position: sitting Prep: DuraPrep Patient monitoring: heart rate, blood pressure and continuous pulse ox Approach: right paramedian Location: L3-4 Injection technique: single-shot Needle Needle type: Sprotte  Needle gauge: 24 G Needle length: 9 cm Assessment Sensory level: T4 Additional Notes Spinal Dosage in OR  .75% Bupivicaine ml       1.2     [id number]   mcg        100    Fentanyl mcg            25      

## 2017-08-22 NOTE — Transfer of Care (Signed)
Immediate Anesthesia Transfer of Care Note  Patient: Anne ReamerRachel C Cline  Procedure(s) Performed: CESAREAN SECTION (N/A )  Patient Location: PACU  Anesthesia Type:Spinal  Level of Consciousness: awake  Airway & Oxygen Therapy: Patient Spontanous Breathing  Post-op Assessment: Report given to RN and Post -op Vital signs reviewed and stable  Post vital signs: stable  Last Vitals:  Vitals:   08/21/17 2303  Pulse: 95  Resp: 20  Temp: 36.8 C    Last Pain:  Vitals:   08/21/17 2309  PainSc: 6       Patients Stated Pain Goal: 0 (08/21/17 2309)  Complications: No apparent anesthesia complications

## 2017-08-22 NOTE — Progress Notes (Signed)
Patient received to room 304 via bed from PACU. S/P primary c/sect for twins at 34.4 weeks gest. Pt oriented to room and routine. Tearful at times due to fafmily circumstances. Her husband had heart valve replacement surgery yesterday and is in intensive care at the  [facility]. He is as yet unaware that she has delivered. Pt and her mother reassured that we will assist in helping them communicate as much as able.

## 2017-08-22 NOTE — Consult Note (Signed)
Neonatology Note:   Attendance at C-section:    I was asked by Dr. Dillard to attend this C/S at 34 4/7 weeks for vaginal bleeding and PTL of surrogate didi twins. The mother is a G4P3, GBS unknown with good prenatal care. BTMZ 10/4.    Twin A ROM 0 hours before delivery, fluid clear. Infant vigorous with good spontaneous cry and tone.+60 sec DCC.  Needed only minimal bulb suctioning. Ap 8/9. Lungs clear to ausc in DR.   Twin B was breech, ROM 0 hours before delivery, fluid clear. Infant vigorous with good spontaneous cry and fair  tone.+60 sec DCC.  Needed only minimal bulb suctioning early then received 1 minute og CPAP at 6 min of life.  Ap 7/8. Lungs clearing to ausc in DR.   Both to NICU for further management, accompanied by surrogate GM.     David C. Ehrmann, MD 

## 2017-08-23 LAB — RH IG WORKUP (INCLUDES ABO/RH)
ABO/RH(D): A NEG
Fetal Screen: NEGATIVE
Gestational Age(Wks): 34.4
Unit division: 0

## 2017-08-23 MED ORDER — IBUPROFEN 600 MG PO TABS: 600.0000 mg | ORAL_TABLET | Freq: Four times a day (QID) | ORAL | 0 refills | Status: DC

## 2017-08-23 MED ORDER — OXYCODONE-ACETAMINOPHEN 5-325 MG PO TABS: 1.0000 | ORAL_TABLET | ORAL | 0 refills | Status: DC | PRN

## 2017-08-23 NOTE — Discharge Summary (Signed)
OB Discharge Summary     Patient Name: Anne ReamerRachel C Cline DOB: 08/19/1981 MRN: [id number]  Date of admission: 08/21/2017 Delivering MD: This patient has no babies on file.  Date of discharge: 08/23/2017  Admitting diagnosis: 34 wks ctx and bleeding Intrauterine pregnancy: 3526w5d     Secondary diagnosis:  Active Problems:   Cesarean delivery delivered   Indication for care or intervention in labor or delivery  Additional problems:None     Discharge diagnosis: Preterm Pregnancy Delivered                                                                                                Post partum procedures:None  Augmentation: N/A  Complications: None  Hospital course:  Onset of Labor With Unplanned C/S  36 y.o. yo G3P3001 at 8226w5d was admitted in Latent Labor on 08/21/2017. Patient had a labor course significant for twin pregnancy breech/breech.  Pt is a surrogate mother. Pt . Membrane Rupture Time/Date:at time of delivery.  The patient went for cesarean section due to Malpresentation, and delivered a Viable infant,This patient has no babies on file. Details of operation can be found in separate operative note. Patient had an uncomplicated postpartum course.  She is ambulating,tolerating a regular diet, passing flatus, and urinating well.  Patient is discharged home in stable condition 08/23/17.  Physical exam  Vitals:   08/23/17 0835 08/23/17 1141 08/23/17 1552 08/23/17 2000  BP: 116/77 116/77 124/72 118/75  Pulse: 88 89 91 99  Resp: 18 18 18 19   Temp: 97.8 F (36.6 C) 97.8 F (36.6 C) 98.2 F (36.8 C) 98.8 F (37.1 C)  TempSrc: Oral Oral Oral Oral  SpO2: 100% 100% 99% 99%  Weight:      Height:       General: alert, cooperative and no distress Lochia: appropriate Uterine Fundus: firm Incision: Healing well with no significant drainage, Dressing is clean, dry, and intact DVT Evaluation: No evidence of DVT seen on physical exam. Negative Homan's sign. Labs: Lab  Results  Component Value Date   WBC 16.7 (H) 08/22/2017   HGB 11.3 (L) 08/22/2017   HCT 34.0 (L) 08/22/2017   MCV 91.6 08/22/2017   PLT 143 (L) 08/22/2017   No flowsheet data found.  Discharge instruction: per After Visit Summary and "Baby and Me Booklet".  After visit meds:  Allergies as of 08/23/2017   No Known Allergies     Medication List    TAKE these medications   ferrous sulfate 325 (65 FE) MG tablet Commonly known as:  FERROUSUL Take 1 tablet (325 mg total) by mouth daily with breakfast.   ibuprofen 600 MG tablet Commonly known as:  ADVIL,MOTRIN Take 1 tablet (600 mg total) by mouth every 6 (six) hours.   oxyCODONE-acetaminophen 5-325 MG tablet Commonly known as:  PERCOCET/ROXICET Take 1 tablet by mouth every 4 (four) hours as needed (pain scale 4-7).   prenatal multivitamin Tabs tablet Take 1 tablet by mouth daily at 12 noon.            Discharge Care Instructions        Start  Ordered   08/23/17 0000  Discharge wound care:    Comments:  Remove honey comb dressing day 5.  Keep incision clean and dry.   08/23/17 2108      Diet: routine diet  Activity: Advance as tolerated. Pelvic rest for 6 weeks.   Outpatient follow up:2 weeks Follow up Appt:No future appointments. Follow up Visit:No Follow-up on file.  Postpartum contraception: undecided  Newborn Data: This patient has no babies on file. Pt is a surrogate mother. Pt to be discharged home in stable condition. 08/23/2017 Kenney Houseman, CNM

## 2017-08-23 NOTE — Plan of Care (Signed)
Problem: Activity: Goal: Will verbalize the importance of balancing activity with adequate rest periods Outcome: Adequate for Discharge Patient has been appropriately active with rest periods. Goal: Ability to tolerate increased activity will improve Outcome: Progressing Activity level is well tolerated.  Problem: Education: Goal: Knowledge of condition will improve Outcome: Progressing Patient verbalizes understanding of post op course and care of low transverse incision.  Problem: Coping: Goal: Ability to cope will improve Outcome: Progressing Patient is less tearful tonight. Her husband is recovering well from his surgery. Her children are with her parents and her (surrogate) twins are being cared for in NICU with their by biologic parents.  Goal: Ability to identify and utilize available resources and services will improve Outcome: Progressing Patient has a supportive family.

## 2017-08-23 NOTE — Progress Notes (Signed)
Initial visit with Anne Contrasachel after the delivery of twins Anne Cline and Anne Cline, for whom she was a surrogate.  Anne Cline shared that she has a close relationship with the twins' mother and that she has been an active participant in [name]'s pregnancy, frequently flying down for appointments, and they've developed a close relationship.  She feels like this will make the transition easier.  She shared that she feels like she's been "babysitting" the twins because her life is so busy and she feels very clear that her son [name] is to be her last child.  Anne Cline was supposed to deliver the twins a little later this month and was hoping to make it to 36 weeks.  Her husband [name] was scheduled to have open heart surgery at the end of November, but [name]'s health continued to decline and he ended up needing surgery on Friday.  Anne Cline was by his side almost non stop until she went into labor late Saturday night and had to have a c-section on Sunday morning.  Anne Cline was tearful as she shared her sadness about being away from [name] and the stress she feels her mother has because she is taking care of their three children (one of whom has been sick) while both of them are in the hospital.  She hopes to be discharged in the morning so she can be with him.  I spent an hour with Anne Cline inviting her to share the story of her traumatic birth and process her emotions.  She shared that she is starting to feel some comfort and recognize God's provision in knowing that things would have been worse if she'd gone into labor after [name] had come home.  She has made preparations for meals, groceries, childcare, transportation, and nursing care for [name] and feels like she's going to be okay.  I encouraged her to continue to stay in touch with her feelings and allow herself to be cared for as well.  We discussed PMADs and the increased symptoms that come with lack of sleep and stress and Anne Cline has also discussed her increased risk with her midwife.    We  concluded the visit with prayer and I will make a referral to the spiritual care team at [facility].   Please page as further needs arise.  Maryanna ShapeAmanda M. [name] [name], M.Div. [facility] Chaplain Pager [phone] Office [phone]

## 2017-08-23 NOTE — Discharge Instructions (Signed)
Cesarean Delivery °Cesarean birth, or cesarean delivery, is the surgical delivery of a baby through an incision in the abdomen and the uterus. This may be referred to as a C-section. This procedure may be scheduled ahead of time, or it may be done in an emergency situation. °Tell a health care provider about: °· Any allergies you have. °· All medicines you are taking, including vitamins, herbs, eye drops, creams, and over-the-counter medicines. °· Any problems you or family members have had with anesthetic medicines. °· Any blood disorders you have. °· Any surgeries you have had. °· Any medical conditions you have. °· Whether you or any members of your family have a history of deep vein thrombosis (DVT) or pulmonary embolism (PE). °What are the risks? °Generally, this is a safe procedure. However, problems may occur, including: °· Infection. °· Bleeding. °· Allergic reactions to medicines. °· Damage to other structures or organs. °· Blood clots. °· Injury to your baby. ° °What happens before the procedure? °· Follow instructions from your health care provider about eating or drinking restrictions. °· Follow instructions from your health care provider about bathing before your procedure to help reduce your risk of infection. °· If you know that you are going to have a cesarean delivery, do not shave your pubic area. Shaving before the procedure may increase your risk of infection. °· Ask your health care provider about: °? Changing or stopping your regular medicines. This is especially important if you are taking diabetes medicines or blood thinners. °? Your pain management plan. This is especially important if you plan to breastfeed your baby. °? How long you will be in the hospital after the procedure. °? Any concerns you may have about receiving blood products if you need them during the procedure. °? Cord blood banking, if you plan to collect your baby’s umbilical cord blood. °· You may also want to ask your  health care provider: °? Whether you will be able to hold or breastfeed your baby while you are still in the operating room. °? Whether your baby can stay with you immediately after the procedure and during your recovery. °? Whether a family member or a person of your choice can go with you into the operating room and stay with you during the procedure, immediately after the procedure, and during your recovery. °· Plan to have someone drive you home when you are discharged from the hospital. °What happens during the procedure? °· Fetal monitors will be placed on your abdomen to monitor your heart rate and your baby's heart rate. °· Depending on the reason for your cesarean delivery, you may have a physical exam or additional testing, such as an ultrasound. °· An IV tube will be inserted into one of your veins. °· You may have your blood or urine tested. °· You will be given antibiotic medicine to help prevent infection. °· You may be given a special warming gown to wear to keep your temperature stable. °· Hair may be removed from your pubic area. °· The skin of your pubic area and lower abdomen will be cleaned with a germ-killing solution (antiseptic). °· A catheter may be inserted into your bladder through your urethra. This drains your urine during the procedure. °· You may be given one or more of the following: °? A medicine to numb the area (local anesthetic). °? A medicine to make you fall asleep (general anesthetic). °? A medicine (regional anesthetic) that is injected into your back or through a small   thin tube placed in your back (spinal anesthetic or epidural anesthetic). This numbs everything below the injection site and allows you to stay awake during your procedure. If this makes you feel nauseous, tell your health care provider. Medicines will be available to help reduce any nausea you may feel. °· An incision will be made in your abdomen, and then in your uterus. °· If you are awake during your  procedure, you may feel tugging and pulling in your abdomen, but you should not feel pain. If you feel pain, tell your health care provider immediately. °· Your baby will be removed from your uterus. You may feel more pressure or pushing while this happens. °· Immediately after birth, your baby will be dried and kept warm. You may be able to hold and breastfeed your baby. The umbilical cord may be clamped and cut during this time. °· Your placenta will be removed from your uterus. °· Your incisions will be closed with stitches (sutures). Staples, skin glue, or adhesive strips may also be applied to the incision in your abdomen. °· Bandages (dressings) will be placed over the incision in your abdomen. °The procedure may vary among health care providers and hospitals. °What happens after the procedure? °· Your blood pressure, heart rate, breathing rate, and blood oxygen level will be monitored often until the medicines you were given have worn off. °· You may continue to receive fluids and medicines through an IV tube. °· You will have some pain. Medicines will be available to help control your pain. °· To help prevent blood clots: °? You may be given medicines. °? You may have to wear compression stockings or devices. °? You will be encouraged to walk around when you are able. °· Hospital staff will encourage and support bonding with your baby. Your hospital may allow you and your baby to stay in the same room (rooming in) during your hospital stay to encourage successful breastfeeding. °· You may be encouraged to cough and breathe deeply often. This helps to prevent lung problems. °· If you have a catheter draining your urine, it will be removed as soon as possible after your procedure. °This information is not intended to replace advice given to you by your health care provider. Make sure you discuss any questions you have with your health care provider. °Document Released: 10/19/2005 Document Revised: 03/26/2016  Document Reviewed: 07/30/2015 °Elsevier Interactive Patient Education © 2017 [employer] ° °

## 2017-08-23 NOTE — Progress Notes (Signed)
Clarene ReamerRachel C Cline [id number] Postpartum Day 1 S/P Primary Cesarean Section due to Active PTL of Breech/Breech Presentation-Twins.  Subjective: Patient up ad lib, denies syncope or dizziness. Reports consuming regular diet without issues and denies N/V. Patient reports no bowel movement but is passing flatus.  Denies issues with urination and reports bleeding is "minimal." Pain is being appropriately managed with use of motrin.   Objective: Temp:  [98.3 F (36.8 C)-98.7 F (37.1 C)] 98.6 F (37 C) (10/22 0000) Pulse Rate:  [68-96] 96 (10/22 0000) Resp:  [16-18] 18 (10/22 0000) BP: (107-121)/(71-85) 113/74 (10/22 0000) SpO2:  [97 %-99 %] 97 % (10/22 0000)   Recent Labs  08/21/17 2340 08/22/17 0510  HGB 12.5 11.3*  HCT 38.0 34.0*  WBC 10.7* 16.7*    Physical Exam:  General: alert, cooperative and no distress Mood/Affect: Bright/Labile Lungs: clear to auscultation, no wheezes, rales or rhonchi, symmetric air entry.  Heart: normal rate and regular rhythm. Breast: breasts appear normal, no suspicious masses, no skin or nipple changes or axillary nodes. Abdomen:  + bowel sounds, Soft, Appropriately Tender, Mildly Distended Incision: healing well, no significant drainage, Honeycomb dressing  Uterine Fundus: firm Lochia: appropriate Skin: Warm, Dry. DVT Evaluation: Calf/Ankle edema is present, but mild JP drain:   None  Assessment Post Operative Day 1 S/P Primary C/S Normal Involution Rh Negative Hemodynamically Stable Surrogate Pregnancy Multiple Stressors   Plan: -Discussed and agreeable to staying in hospital until tomorrow -Patient did verbalize interest in early discharge (0600) -Reports husband and babies doing well -Reports good support system [facility] -Encouraged continued ambulation -Okay for shower -Okay for IV removal -Continue other mgmt as ordered -Plan for discharge tomorrow -Dr. ND updated on patient  status  Cherre RobinsJessica L Odis Wickey MSN, CNM 08/23/2017, 5:58 AM

## 2017-08-25 LAB — TYPE AND SCREEN
ABO/RH(D): A NEG
Antibody Screen: POSITIVE
Unit division: 0
Unit division: 0

## 2017-08-25 LAB — BPAM RBC
Blood Product Expiration Date: 201811102359
Blood Product Expiration Date: 201811192359
Unit Type and Rh: 9500
Unit Type and Rh: 9500

## 2017-09-09 ENCOUNTER — Ambulatory Visit: Payer: BLUE CROSS/BLUE SHIELD | Admitting: Family Medicine

## 2017-09-09 ENCOUNTER — Encounter: Payer: Self-pay | Admitting: Family Medicine

## 2017-09-09 VITALS — BP 100/80 | HR 96 | Ht 61.0 in | Wt 119.4 lb

## 2017-09-09 DIAGNOSIS — Z7689 Persons encountering health services in other specified circumstances: Secondary | ICD-10-CM

## 2017-09-09 DIAGNOSIS — Z0000 Encounter for general adult medical examination without abnormal findings: Secondary | ICD-10-CM

## 2017-09-09 NOTE — Progress Notes (Signed)
Subjective:  Anne Cline is a 36 y.o. female who presents today for an encounter to establish care  HPI:  Anne Cline has no acute complaints today.   She recently completed a surrogate pregnancy for 1 of her best friends.  She underwent cesarean delivery approximately 2-1/2 weeks ago for twin gestation.  Reports that she actually had a relatively normal pregnancy.  No issues with elevated blood pressure her blood sugar.  She has done well since her delivery.  She has a little bit of pain at her incision site.  No fever or chills.  No drainage from the area.  She will be going back to see her OB for her 6-week postpartum visit.  She has not been able to exercise recently due to her pregnancy and cesarean delivery.  Otherwise she is doing well.  She has 3 other children at home ages 463 through 8716.  States that they are doing well.  Reports that she recently had her Pap smear, flu shot, and Tdap.  Review of systems: Per HPI otherwise complete review of systems negative.  PMH:  The following were reviewed and entered/updated in epic: Past Medical History:  Diagnosis Date  . Medical history non-contributory    Patient Active Problem List   Diagnosis Date Noted  . Cesarean delivery delivered 08/22/2017  . Indication for care or intervention in labor or delivery 08/22/2017  . SVD (spontaneous vaginal delivery) 07/25/2014  . Skin tag of vaginal mucosa 07/25/2014   Past Surgical History:  Procedure Laterality Date  . WISDOM TOOTH EXTRACTION      Family History  Problem Relation Age of Onset  . Cancer Mother        Breast  . Heart attack Maternal Grandfather    Medications- reviewed and updated Current Outpatient Medications  Medication Sig Dispense Refill  . Prenatal Vit-Fe Fumarate-FA (PRENATAL MULTIVITAMIN) TABS tablet Take 1 tablet by mouth daily at 12 noon.     No current facility-administered medications for this visit.     Allergies-reviewed and updated No  Known Allergies  Social History   Socioeconomic History  . Marital status: Married    Spouse name: None  . Number of children: 3  . Years of education: None  . Highest education level: None  Social Needs  . Financial resource strain: None  . Food insecurity - worry: None  . Food insecurity - inability: None  . Transportation needs - medical: None  . Transportation needs - non-medical: None  Occupational History  . Occupation: Psychologist, sport and exerciseBusiness Owner  Tobacco Use  . Smoking status: Never Smoker  . Smokeless tobacco: Never Used  Substance and Sexual Activity  . Alcohol use: Yes  . Drug use: No  . Sexual activity: Yes    Partners: Male    Birth control/protection: None  Other Topics Concern  . None  Social History Narrative  . None   Objective:  Physical Exam: BP 100/80   Pulse 96   Ht 5\' 1"  (1.549 m)   Wt 119 lb 6.4 oz (54.2 kg)   SpO2 99%   Breastfeeding? No   BMI 22.56 kg/m   Body mass index is 22.56 kg/m. Gen: NAD, resting comfortably CV: RRR with no murmurs appreciated Pulm: NWOB, CTAB with no crackles, wheezes, or rhonchi GI: Cesarean scar well-healed.  No areas of erythema or fluctuance.  Bowel sounds present.  Nontender nondistended MSK: no edema, cyanosis, or clubbing noted Skin: warm, dry Neuro: CN2-12 grossly intact. Strength 5/5 in  upper and lower extremities. Reflexes symmetric and intact bilaterally.  Psych: Normal affect and thought content  Assessment/Plan:  Preventative Healthcare: Up-to-date on Pap, flu, HIV screen, and Tdap.  Will need to obtain these records from her OB.  Patient Counseling:  -Nutrition: Stressed importance of moderation in sodium/caffeine intake, saturated fat and cholesterol, caloric balance, sufficient intake of fresh fruits, vegetables, and fiber.  -Stressed the importance of regular exercise.   -Substance Abuse: Discussed cessation/primary prevention of tobacco, alcohol, or other drug use; driving or other dangerous activities  under the influence; availability of treatment for abuse.   -Injury prevention: Discussed safety belts, safety helmets, smoke detector, smoking near bedding or upholstery.   -Sexuality: Discussed sexually transmitted diseases, partner selection, use of condoms, avoidance of unintended pregnancy and contraceptive alternatives.   -Dental health: Discussed importance of regular tooth brushing, flossing, and dental visits.  -Health maintenance and immunizations reviewed. Please refer to Health maintenance section.  Return to care in 1 year for next preventative visit.   Time Spent: I spent 30 minutes face-to-face with the patient, with more than half spent on counseling on preventative healthcare including nutrition, exercise, health maintenance, and injury prevention.  Additionally discussed normal process of healing for her cesarean and warning signs to look out for including worsening pain, discharge, and/or erythema.   Katina Degreealeb M. Jimmey RalphParker, MD 09/09/2017 10:39 AM

## 2017-12-31 DIAGNOSIS — Z01419 Encounter for gynecological examination (general) (routine) without abnormal findings: Secondary | ICD-10-CM | POA: Diagnosis not present

## 2017-12-31 DIAGNOSIS — Z124 Encounter for screening for malignant neoplasm of cervix: Secondary | ICD-10-CM | POA: Diagnosis not present

## 2017-12-31 DIAGNOSIS — Z3040 Encounter for surveillance of contraceptives, unspecified: Secondary | ICD-10-CM | POA: Diagnosis not present

## 2017-12-31 DIAGNOSIS — Z6823 Body mass index (BMI) 23.0-23.9, adult: Secondary | ICD-10-CM | POA: Diagnosis not present

## 2018-03-31 DIAGNOSIS — L918 Other hypertrophic disorders of the skin: Secondary | ICD-10-CM | POA: Diagnosis not present

## 2018-03-31 DIAGNOSIS — L814 Other melanin hyperpigmentation: Secondary | ICD-10-CM | POA: Diagnosis not present

## 2018-03-31 DIAGNOSIS — D1801 Hemangioma of skin and subcutaneous tissue: Secondary | ICD-10-CM | POA: Diagnosis not present

## 2018-03-31 DIAGNOSIS — D2239 Melanocytic nevi of other parts of face: Secondary | ICD-10-CM | POA: Diagnosis not present

## 2018-04-20 DIAGNOSIS — R109 Unspecified abdominal pain: Secondary | ICD-10-CM | POA: Diagnosis not present

## 2018-08-11 ENCOUNTER — Encounter: Payer: Self-pay | Admitting: Family Medicine

## 2018-08-11 ENCOUNTER — Ambulatory Visit: Payer: BLUE CROSS/BLUE SHIELD | Admitting: Family Medicine

## 2018-08-11 VITALS — BP 114/64 | HR 85 | Temp 98.5°F | Ht 61.0 in | Wt 122.8 lb

## 2018-08-11 DIAGNOSIS — R059 Cough, unspecified: Secondary | ICD-10-CM

## 2018-08-11 DIAGNOSIS — R05 Cough: Secondary | ICD-10-CM

## 2018-08-11 MED ORDER — IPRATROPIUM BROMIDE 0.06 % NA SOLN: 2.0000 | Freq: Four times a day (QID) | NASAL | 0 refills | Status: DC

## 2018-08-11 MED ORDER — AZITHROMYCIN 250 MG PO TABS: ORAL_TABLET | ORAL | 0 refills | Status: DC

## 2018-08-11 MED ORDER — ALBUTEROL SULFATE HFA 108 (90 BASE) MCG/ACT IN AERS: 2.0000 | INHALATION_SPRAY | Freq: Four times a day (QID) | RESPIRATORY_TRACT | 0 refills | Status: DC | PRN

## 2018-08-11 NOTE — Patient Instructions (Signed)
Start the atrovent.  Use the albuterol as needed for cough and wheezing.   Start the zpack if your symptoms worsen or do not improve in a few days.  Please stay well hydrated.  You can take tylenol and/or motrin as needed for low grade fever and pain.  Please let me know if your symptoms worsen or fail to improve.  Take care, Dr Jimmey Ralph

## 2018-08-11 NOTE — Progress Notes (Signed)
   Subjective:  Anne Cline is a 37 y.o. female who presents today for same-day appointment with a chief complaint of cough.   HPI:  Cough, Acute problem Started 2 weeks ago.  Stable over that time.  Associated with occasional wheeze, headaches, and rhinorrhea.  She has also had intermittent fevers over last couple weeks however does not have any fevers today.  She takes Tylenol which helps modestly.  She has had similar symptoms several years ago and was treated with an albuterol inhaler.  She has had several sick contacts at home.  Symptoms are worse at night.  No obvious alleviating or aggravating factors.   ROS: Per HPI  PMH: She reports that she has never smoked. She has never used smokeless tobacco. She reports that she drinks alcohol. She reports that she does not use drugs.  Objective:  Physical Exam: BP 114/64 (BP Location: Left Arm, Patient Position: Sitting, Cuff Size: Normal)   Pulse 85   Temp 98.5 F (36.9 C) (Oral)   Ht 5\' 1"  (1.549 m)   Wt 122 lb 12.8 oz (55.7 kg)   SpO2 98%   BMI 23.20 kg/m   Gen: NAD, resting comfortably HEENT: TMs clear bilaterally.  Oropharynx erythematous without exudate.  Nasal mucosa erythematous and boggy bilaterally with clear nasal discharge. CV: RRR with no murmurs appreciated Pulm: NWOB, CTAB with no crackles, wheezes, or rhonchi  Assessment/Plan:  Cough No red flag signs or symptoms.  No signs of bacterial infection.  Start Atrovent nasal spray for rhinorrhea and sinus congestion.  Patient may have an underlying asthma component given her past response to albuterol.  A new prescription for albuterol as well.  Given that symptoms have been persistent for the last 2 weeks, I sent in a "pocket prescription" for azithromycin with strict instruction to not start unless symptoms worsen or fail to improve over the next several days.  Encouraged good oral hydration.  Recommended Tylenol and/or Motrin as needed.  Discussed reasons return to  care.  Follow-up as needed.  Katina Degree. Jimmey Ralph, MD 08/11/2018 11:40 AM

## 2019-01-13 ENCOUNTER — Other Ambulatory Visit: Payer: Self-pay

## 2019-01-13 ENCOUNTER — Telehealth: Payer: Self-pay | Admitting: Family Medicine

## 2019-01-13 MED ORDER — ALBUTEROL SULFATE HFA 108 (90 BASE) MCG/ACT IN AERS: 2.0000 | INHALATION_SPRAY | Freq: Four times a day (QID) | RESPIRATORY_TRACT | 0 refills | Status: DC | PRN

## 2019-01-13 NOTE — Telephone Encounter (Signed)
Rx Sent  

## 2019-01-13 NOTE — Telephone Encounter (Signed)
See note

## 2019-01-13 NOTE — Telephone Encounter (Signed)
Copied from CRM [id number]. Topic: Quick Communication - Rx Refill/Question >> Jan 13, 2019 12:05 PM Elliot Gault wrote: Medication: albuterol (PROVENTIL HFA;VENTOLIN HFA) 108 (90 Base) MCG/ACT inhaler    Has the patient contacted their pharmacy? No, [place] and contacted PCP office directly  (Preferred Pharmacy (with phone number or street name):  [id number] [place], [place] - [address] [phone] (Phone) [phone] (Fax)   Agent: Please be advised that RX refills may take up to 3 business days. We ask that you follow-up with your pharmacy.

## 2019-01-27 ENCOUNTER — Ambulatory Visit (INDEPENDENT_AMBULATORY_CARE_PROVIDER_SITE_OTHER): Payer: BC Managed Care – PPO | Admitting: Family Medicine

## 2019-01-27 ENCOUNTER — Encounter: Payer: Self-pay | Admitting: Family Medicine

## 2019-01-27 ENCOUNTER — Other Ambulatory Visit: Payer: Self-pay

## 2019-01-27 ENCOUNTER — Ambulatory Visit: Payer: Self-pay | Admitting: Family Medicine

## 2019-01-27 DIAGNOSIS — R059 Cough, unspecified: Secondary | ICD-10-CM

## 2019-01-27 DIAGNOSIS — R05 Cough: Secondary | ICD-10-CM

## 2019-01-27 MED ORDER — DOXYCYCLINE HYCLATE 100 MG PO TABS: 100.0000 mg | ORAL_TABLET | Freq: Two times a day (BID) | ORAL | 0 refills | Status: DC

## 2019-01-27 NOTE — Telephone Encounter (Signed)
Please call and schedule webex.

## 2019-01-27 NOTE — Progress Notes (Signed)
    Chief Complaint:  Anne Cline is a 38 y.o. female who presents today for a virtual office visit with a chief complaint of cough.   Assessment/Plan:  Cough No apparent respiratory distress based on visual examination.  Given her length of symptoms and worsening clinical status, will start course of doxycycline.  She will continue using albuterol as needed.  Offered cough medication such as Tessalon however patient declined.  Encouraged good oral hydration.  Discussed reasons to return to care and seek emergent care.  Follow-up as needed.   Subjective:  HPI:  Cough Started about a week ago. Worsened over that time. Associated with sore throat, malaise, and wheezing. Both of her children were recently diagnosed with pneumonia and treated with antibiotics.  She has tried using albuterol inhaler which does not help with her symptoms.  No rhinorrhea.  No fevers or chills.  No other obvious alleviating or aggravating factors.  ROS: Per HPI  PMH: She reports that she has never smoked. She has never used smokeless tobacco. She reports current alcohol use. She reports that she does not use drugs.      Objective/Observations  Physical Exam: Gen: NAD, resting comfortably Pulm: Normal work of breathing, speaking in full sentences. Neuro: Grossly normal, moves all extremities Psych: Normal affect and thought content  No results found for this or any previous visit (from the past 24 hour(s)).   Virtual Visit via Video   I connected with Anne Cline on 01/27/19 at  3:20 PM EDT by a video enabled telemedicine application and verified that I am speaking with the correct person using two identifiers. I discussed the limitations of evaluation and management by telemedicine and the availability of in person appointments. The patient expressed understanding and agreed to proceed.   Patient location: Home Provider location: [place] [employer] Persons participating in the  virtual visit: Myself and patient     Katina Degree. Jimmey Ralph, MD 01/27/2019 3:30 PM

## 2019-01-27 NOTE — Telephone Encounter (Signed)
  Pt called in c/o wheezing and shortness of breath not bad.  "I'm not in distress or anything like that".   "My 2 kids had pneumonia 2 wks ago and were on antibiotics".    "I think that's how I got sick was from them".  I let her know they are not seeing pts in the office due to the coronavirus pandemic.    I verified her phone and e mail address.   I let her know someone would be calling her.  She was agreeable to this plan.  I sent these notes to Dr. Jimmey Ralph for further disposition.   Reason for Disposition . [1] MILD difficulty breathing (e.g., minimal/no SOB at rest, SOB with walking, pulse <100) AND [2] NEW-onset or WORSE than normal  Answer Assessment - Initial Assessment Questions 1. RESPIRATORY STATUS: "Describe your breathing?" (e.g., wheezing, shortness of breath, unable to speak, severe coughing)      I'm feeling tired.   My kids had pneumonia 2 wks ago.  Took antibiotics.   I feel maybe a low grade fever but no thermometer. I hear wheezing when I breath.  Inhaler is not helping. 2. ONSET: "When did this breathing problem begin?"      4-5 days ago.   I'm clearing my throat a lot.  No cough 3. PATTERN "Does the difficult breathing come and go, or has it been constant since it started?"      Today been feeling it all day. 4. SEVERITY: "How bad is your breathing?" (e.g., mild, moderate, severe)    - MILD: No SOB at rest, mild SOB with walking, speaks normally in sentences, can lay down, no retractions, pulse < 100.    - MODERATE: SOB at rest, SOB with minimal exertion and prefers to sit, cannot lie down flat, speaks in phrases, mild retractions, audible wheezing, pulse 100-120.    - SEVERE: Very SOB at rest, speaks in single words, struggling to breathe, sitting hunched forward, retractions, pulse > 120      I'm feeling short of breath but not bad but I notice it. 5. RECURRENT SYMPTOM: "Have you had difficulty breathing before?" If so, ask: "When was the last time?" and "What happened  that time?"      I use an inhaler only for a cold.   Not have asthma. 6. CARDIAC HISTORY: "Do you have any history of heart disease?" (e.g., heart attack, angina, bypass surgery, angioplasty)      No 7. LUNG HISTORY: "Do you have any history of lung disease?"  (e.g., pulmonary embolus, asthma, emphysema)     No 8. CAUSE: "What do you think is causing the breathing problem?"      Probably exposure to my 2 kids that had pneumonia. 9. OTHER SYMPTOMS: "Do you have any other symptoms? (e.g., dizziness, runny nose, cough, chest pain, fever)     A little nasal congestion not much at all. 10. PREGNANCY: "Is there any chance you are pregnant?" "When was your last menstrual period?"       No 11. TRAVEL: "Have you traveled out of the country in the last month?" (e.g., travel history, exposures)       No travels  Protocols used: BREATHING DIFFICULTY-A-AH

## 2019-01-30 ENCOUNTER — Ambulatory Visit: Payer: Self-pay | Admitting: Family Medicine

## 2019-01-30 NOTE — Telephone Encounter (Signed)
I returned pt's call however she informed me she spoke with Dr. Jimmey Ralph last week and she is doing much better now.   She has not called back so it must be an old message.  I let her know I'm glad she was better.

## 2019-01-30 NOTE — Telephone Encounter (Signed)
Patient was seen for Webex visit.

## 2019-03-14 DIAGNOSIS — J020 Streptococcal pharyngitis: Secondary | ICD-10-CM | POA: Diagnosis not present

## 2019-06-23 ENCOUNTER — Ambulatory Visit (INDEPENDENT_AMBULATORY_CARE_PROVIDER_SITE_OTHER): Payer: BC Managed Care – PPO | Admitting: Family Medicine

## 2019-06-23 ENCOUNTER — Encounter: Payer: Self-pay | Admitting: Family Medicine

## 2019-06-23 DIAGNOSIS — R300 Dysuria: Secondary | ICD-10-CM | POA: Diagnosis not present

## 2019-06-23 DIAGNOSIS — B9689 Other specified bacterial agents as the cause of diseases classified elsewhere: Secondary | ICD-10-CM | POA: Diagnosis not present

## 2019-06-23 DIAGNOSIS — N760 Acute vaginitis: Secondary | ICD-10-CM

## 2019-06-23 LAB — POC URINALSYSI DIPSTICK (AUTOMATED)
Bilirubin, UA: NEGATIVE
Blood, UA: NEGATIVE
Glucose, UA: NEGATIVE
Ketones, UA: POSITIVE
Leukocytes, UA: NEGATIVE
Nitrite, UA: NEGATIVE
Protein, UA: NEGATIVE
Spec Grav, UA: 1.01 (ref 1.010–1.025)
Urobilinogen, UA: 0.2 E.U./dL
pH, UA: 7 (ref 5.0–8.0)

## 2019-06-23 MED ORDER — METRONIDAZOLE 500 MG PO TABS: 500.0000 mg | ORAL_TABLET | Freq: Three times a day (TID) | ORAL | 0 refills | Status: DC

## 2019-06-23 NOTE — Progress Notes (Signed)
Virtual Visit via Video   Due to the COVID-19 pandemic, this visit was completed with telemedicine (audio/video) technology to reduce patient and provider exposure as well as to preserve personal protective equipment.   I connected with Anne Cline by a video enabled telemedicine application and verified that I am speaking with the correct person using two identifiers. Location patient: Home Location provider:  [facility], Office Persons participating in the virtual visit: Odetta, [place], DO   I discussed the limitations of evaluation and management by telemedicine and the availability of in person appointments. The patient expressed understanding and agreed to proceed.  Care Team   Patient Care Team: Vivi Barrack, MD as PCP - General (Family Medicine)  Subjective:   HPI: Patient with a few day history of vaginal itching and burning.  She is mom of 3, married, monogamous relationship.  Actually active lately due to stress.  Lots of swimming.  Recently finished menses.  No history of UTIs or vaginitis so googled symptoms.  No treatment.  Fishy odor.  Yellow to brown discharge.  Patient Active Problem List   Diagnosis Date Noted  . Cesarean delivery delivered 08/22/2017  . Indication for care or intervention in labor or delivery 08/22/2017  . SVD (spontaneous vaginal delivery) 07/25/2014  . Skin tag of vaginal mucosa 07/25/2014    Social History   Tobacco Use  . Smoking status: Never Smoker  . Smokeless tobacco: Never Used  Substance Use Topics  . Alcohol use: Yes    Current Outpatient Medications:  .  doxycycline (VIBRA-TABS) 100 MG tablet, Take 1 tablet (100 mg total) by mouth 2 (two) times daily., Disp: 14 tablet, Rfl: 0 .  metroNIDAZOLE (FLAGYL) 500 MG tablet, Take 1 tablet (500 mg total) by mouth 3 (three) times daily., Disp: 21 tablet, Rfl: 0 .  Multiple Vitamin (MULTIVITAMIN) tablet, Take 1 tablet by mouth daily., Disp: , Rfl:   No Known  Allergies  Objective:   VITALS: Per patient if applicable, see vitals. GENERAL: Alert and in no acute distress. CARDIOPULMONARY: No increased WOB. Speaking in clear sentences.  PSYCH: Pleasant and cooperative. Speech normal rate and rhythm. Affect is appropriate. Insight and judgement are appropriate. Attention is focused, linear, and appropriate.  NEURO: Oriented as arrived to appointment on time with no prompting.   Assessment and Plan:   Diagnoses and all orders for this visit:  Dysuria -     Urine Culture -     POCT Urinalysis Dipstick (Automated)  Bacterial vaginitis Comments: Urine dip clear.  Symptoms most consistent with BV.  Discussed diagnosis and expectations.  Treating with Flagyl.  Probiotics recommended.   Orders: -     metroNIDAZOLE (FLAGYL) 500 MG tablet; Take 1 tablet (500 mg total) by mouth 3 (three) times daily.   Marland Kitchen COVID-19 Education: The signs and symptoms of COVID-19 were discussed with the patient and how to seek care for testing if needed. The importance of social distancing was discussed today. . Reviewed expectations re: course of current medical issues. . Discussed self-management of symptoms. . Outlined signs and symptoms indicating need for more acute intervention. . Patient verbalized understanding and all questions were answered. Marland Kitchen Health Maintenance issues including appropriate healthy diet, exercise, and smoking avoidance were discussed with patient. . See orders for this visit as documented in the electronic medical record.  Briscoe Deutscher, DO  Records requested if needed. Time spent: 15 minutes, of which >50% was spent in obtaining information about her symptoms,  reviewing her previous labs, evaluations, and treatments, counseling her about her condition (please see the discussed topics above), and developing a plan to further investigate it; she had a number of questions which I addressed.

## 2019-06-24 LAB — URINE CULTURE
MICRO NUMBER:: 798077
SPECIMEN QUALITY:: ADEQUATE

## 2019-06-30 ENCOUNTER — Telehealth: Payer: Self-pay | Admitting: Family Medicine

## 2019-06-30 NOTE — Telephone Encounter (Signed)
See note  Copied from [place] [id number]. Topic: General - Other >> Jun 30, 2019 11:00 AM Parke Poisson wrote: Reason for CRM: Pt would like a call back to discuss lab results

## 2019-06-30 NOTE — Telephone Encounter (Addendum)
Pt saw Dr Juleen China on virtual visit. Pt also calling to to advise she is still symptomatic, not seeing any improvement wit her infection..  Pt having discharge and odor, no pain.  Pt states is is a white rusty like substance. Pt thinks may be vaginal vaginosis.  [id number] , [place] [phone] (Phone) [phone] (Fax)   Also helped pt rest mychart password as she was unable to log in

## 2019-06-30 NOTE — Telephone Encounter (Signed)
Flagyl would have treated BV. May be yeast infection. Okay diflucan 150 mg po x 1, again 3 days later to cover yeast. Needs pelvic if not improving.

## 2019-06-30 NOTE — Telephone Encounter (Signed)
Patient seen by your on  8/21. She was given Metronidazole.

## 2019-07-03 ENCOUNTER — Other Ambulatory Visit: Payer: Self-pay

## 2019-07-03 MED ORDER — FLUCONAZOLE 150 MG PO TABS: 150.0000 mg | ORAL_TABLET | Freq: Once | ORAL | 0 refills | Status: AC

## 2019-07-03 NOTE — Telephone Encounter (Signed)
Patient called returning [place] call. Patient call back [phone]

## 2019-07-03 NOTE — Telephone Encounter (Signed)
See note

## 2019-07-03 NOTE — Telephone Encounter (Signed)
Called patient reviewed all information. Will take diflucan if no improvement will call for appointment.

## 2019-07-03 NOTE — Telephone Encounter (Signed)
Left message to return call to our office.  

## 2019-09-25 DIAGNOSIS — F321 Major depressive disorder, single episode, moderate: Secondary | ICD-10-CM | POA: Diagnosis not present

## 2019-09-25 DIAGNOSIS — Z0000 Encounter for general adult medical examination without abnormal findings: Secondary | ICD-10-CM | POA: Diagnosis not present

## 2019-09-25 DIAGNOSIS — Z3009 Encounter for other general counseling and advice on contraception: Secondary | ICD-10-CM | POA: Diagnosis not present

## 2019-09-25 DIAGNOSIS — A6000 Herpesviral infection of urogenital system, unspecified: Secondary | ICD-10-CM | POA: Diagnosis not present

## 2019-10-13 IMAGING — US US MFM OB LIMITED
1 series · 7 of 7 positions shown · non-contrast
Comparison: none

[Series 1: us mfm ob limited · 7 acquisitions, 7 frames shown]
[im 1/7]
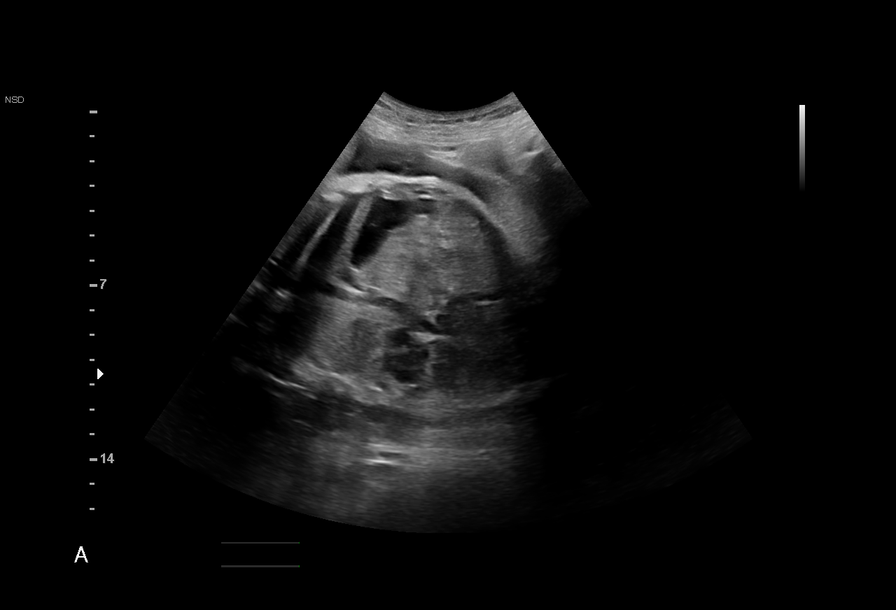
[im 2/7]
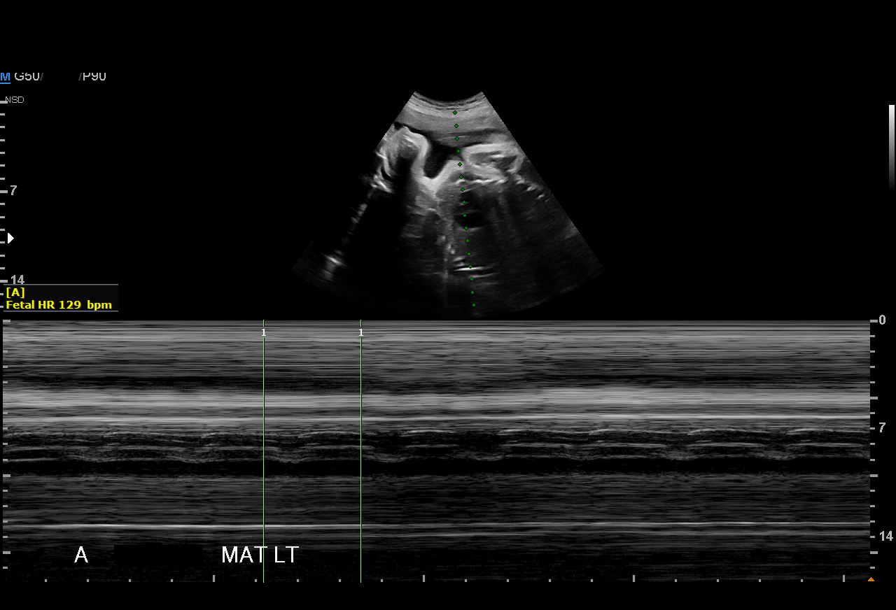
[im 3/7]
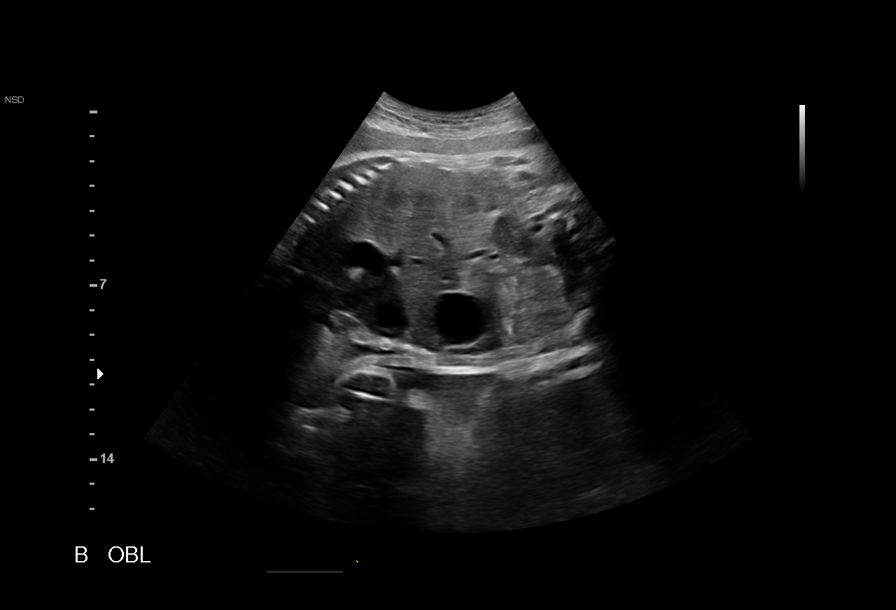
[im 4/7]
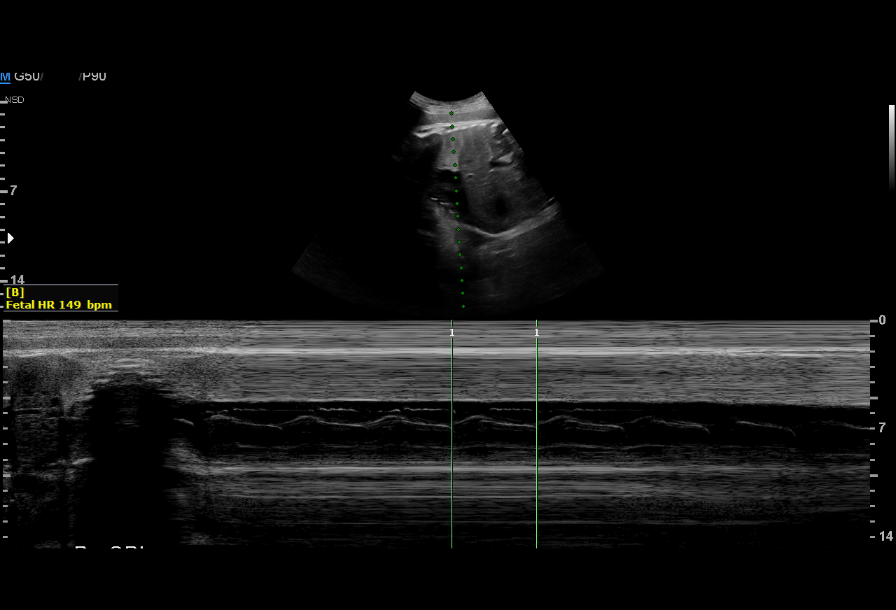
[im 5/7]
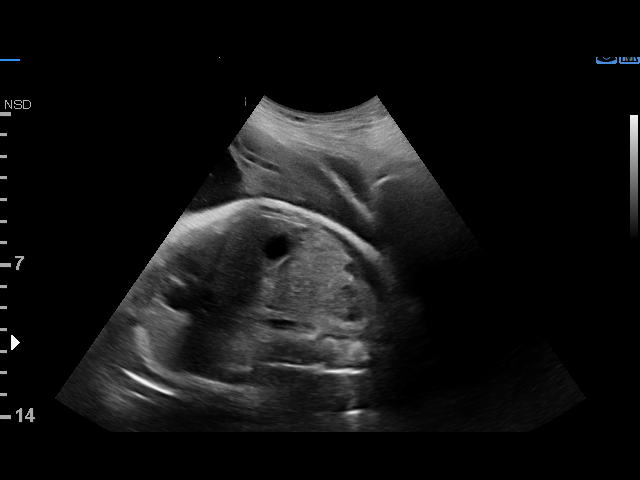
[im 6/7]
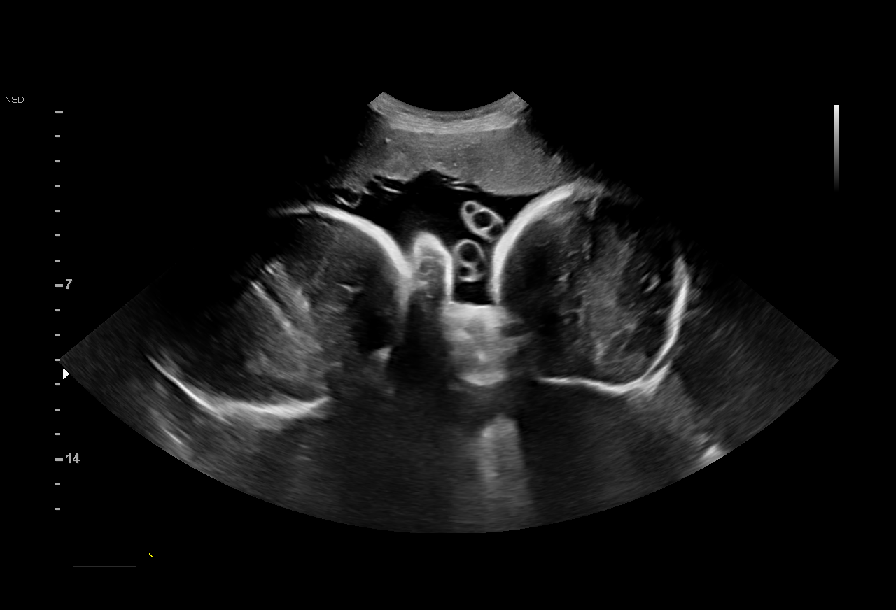
[im 7/7]
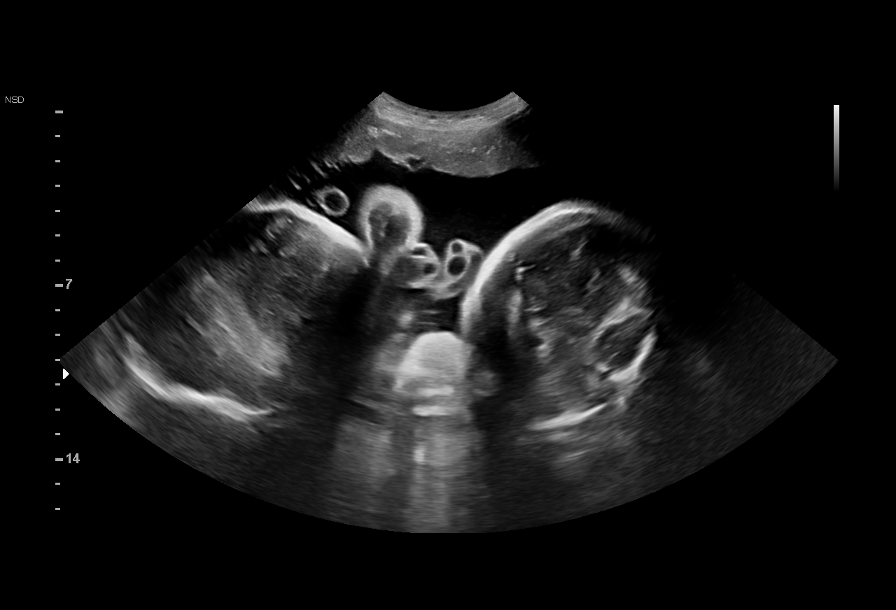

[7 of 7 positions shown; findings below may reference images not displayed]

Obstetrics &
Gynecology
[id number]
MAU/Triage

1  VAGGELI TACK             [id number]      [id number]     [id number]
Indications

34 weeks gestation of pregnancy
Twin pregnancy, di/di, third trimester
Vaginal bleeding in pregnancy, third trimester
Preterm contractions
Determine Fetal presentation by ultrasound
OB History

Blood Type:            Height:  5'0"   Weight (lb):  137       BMI:
Gravidity:    4         Term:   3
Living:       3
Fetal Evaluation (Fetus A)

Num Of Fetuses:     2
Fetal Heart         129
Rate(bpm):
Cardiac Activity:   Observed
Fetal Lie:          Maternal left side
Presentation:       Breech

Amniotic Fluid
AFI FV:      Subjectively within normal limits
Gestational Age (Fetus A)
Best:          34w 3d     Det. By:  Previous Ultrasound      EDD:   09/29/17

Fetal Evaluation (Fetus B)

Num Of Fetuses:     2
Fetal Heart         149
Rate(bpm):
Cardiac Activity:   Observed
Fetal Lie:          Upper Fetus
Presentation:       Oblique breech

Amniotic Fluid
AFI FV:      Subjectively within normal limits
Gestational Age (Fetus B)

Best:          34w 3d     Det. By:  Previous Ultrasound      EDD:   09/29/17
Impression

Dichorionic/diamniotic twin pregnancy at 34+3 weeks
Breech/breech presentation
Normal amniotic fluid volume x 2
Recommendations

Follow-up as clinically indicated

## 2020-02-28 ENCOUNTER — Telehealth: Payer: Self-pay | Admitting: Family Medicine

## 2020-02-28 ENCOUNTER — Ambulatory Visit: Payer: Self-pay | Admitting: Family Medicine

## 2020-02-28 ENCOUNTER — Other Ambulatory Visit: Payer: Self-pay

## 2020-02-28 DIAGNOSIS — Z0289 Encounter for other administrative examinations: Secondary | ICD-10-CM

## 2020-02-28 NOTE — Telephone Encounter (Signed)
Patient spoke with [facility] 02/28/20 at 10:53 am stating that he has recently been bit by a tick.  States he has swelling with redness around bite.  States he has a headache and sore throat.  Voice is scratchy.  States bite is on the right side of lower abd.  When patient removed tick it was embedded.  The tick had a white spot on it.  Not sure if she got the head of the tick out.  States she has a scab over the bit now.    Patient was advised to see PCP within 24 hours.    Patient was scheduled for a virtual visit with Dr. Caryl Never.

## 2020-02-28 NOTE — Telephone Encounter (Signed)
fyi

## 2020-03-01 NOTE — Telephone Encounter (Signed)
Noted  

## 2022-01-01 DIAGNOSIS — D2272 Melanocytic nevi of left lower limb, including hip: Secondary | ICD-10-CM | POA: Diagnosis not present

## 2022-01-01 DIAGNOSIS — D2239 Melanocytic nevi of other parts of face: Secondary | ICD-10-CM | POA: Diagnosis not present

## 2022-01-01 DIAGNOSIS — D2262 Melanocytic nevi of left upper limb, including shoulder: Secondary | ICD-10-CM | POA: Diagnosis not present

## 2022-01-01 DIAGNOSIS — D224 Melanocytic nevi of scalp and neck: Secondary | ICD-10-CM | POA: Diagnosis not present

## 2022-01-01 DIAGNOSIS — D225 Melanocytic nevi of trunk: Secondary | ICD-10-CM | POA: Diagnosis not present

## 2022-01-01 DIAGNOSIS — D2271 Melanocytic nevi of right lower limb, including hip: Secondary | ICD-10-CM | POA: Diagnosis not present

## 2022-01-01 DIAGNOSIS — D485 Neoplasm of uncertain behavior of skin: Secondary | ICD-10-CM | POA: Diagnosis not present

## 2022-01-01 DIAGNOSIS — D1801 Hemangioma of skin and subcutaneous tissue: Secondary | ICD-10-CM | POA: Diagnosis not present

## 2022-01-01 DIAGNOSIS — D2261 Melanocytic nevi of right upper limb, including shoulder: Secondary | ICD-10-CM | POA: Diagnosis not present

## 2022-01-21 ENCOUNTER — Ambulatory Visit (INDEPENDENT_AMBULATORY_CARE_PROVIDER_SITE_OTHER): Payer: 59 | Admitting: Family Medicine

## 2022-01-21 VITALS — BP 111/75 | HR 90 | Temp 98.2°F | Ht 61.0 in | Wt 131.8 lb

## 2022-01-21 DIAGNOSIS — Z0000 Encounter for general adult medical examination without abnormal findings: Secondary | ICD-10-CM | POA: Diagnosis not present

## 2022-01-21 DIAGNOSIS — Z13220 Encounter for screening for lipoid disorders: Secondary | ICD-10-CM

## 2022-01-21 DIAGNOSIS — Z131 Encounter for screening for diabetes mellitus: Secondary | ICD-10-CM | POA: Diagnosis not present

## 2022-01-21 LAB — COMPREHENSIVE METABOLIC PANEL
ALT: 25 U/L (ref 0–35)
AST: 20 U/L (ref 0–37)
Albumin: 4.4 g/dL (ref 3.5–5.2)
Alkaline Phosphatase: 40 U/L (ref 39–117)
BUN: 17 mg/dL (ref 6–23)
CO2: 24 mEq/L (ref 19–32)
Calcium: 8.9 mg/dL (ref 8.4–10.5)
Chloride: 104 mEq/L (ref 96–112)
Creatinine, Ser: 0.56 mg/dL (ref 0.40–1.20)
GFR: 114.41 mL/min (ref >60.00)
Glucose, Bld: 82 mg/dL (ref 70–99)
Potassium: 3.7 mEq/L (ref 3.5–5.1)
Sodium: 135 mEq/L (ref 135–145)
Total Bilirubin: 0.4 mg/dL (ref 0.2–1.2)
Total Protein: 6.9 g/dL (ref 6.0–8.3)

## 2022-01-21 LAB — LIPID PANEL
Cholesterol: 217 mg/dL — ABNORMAL HIGH (ref 0–200)
HDL: 43.1 mg/dL (ref >39.00)
LDL Cholesterol: 155 mg/dL — ABNORMAL HIGH (ref 0–99)
NonHDL: 173.64
Total CHOL/HDL Ratio: 5
Triglycerides: 93 mg/dL (ref 0.0–149.0)
VLDL: 18.6 mg/dL (ref 0.0–40.0)

## 2022-01-21 LAB — CBC
HCT: 41 % (ref 36.0–46.0)
Hemoglobin: 13.7 g/dL (ref 12.0–15.0)
MCHC: 33.5 g/dL (ref 30.0–36.0)
MCV: 88.1 fl (ref 78.0–100.0)
Platelets: 198 10*3/uL (ref 150.0–400.0)
RBC: 4.66 Mil/uL (ref 3.87–5.11)
RDW: 13.5 % (ref 11.5–15.5)
WBC: 6.6 10*3/uL (ref 4.0–10.5)

## 2022-01-21 LAB — TSH: TSH: 1.5 u[IU]/mL (ref 0.35–5.50)

## 2022-01-21 NOTE — Patient Instructions (Signed)
It was very nice to see you today! ? ?Keep up the good work! ? ?We will check blood work today. ? ?Please come back in 1 year for your next physical.  Come back sooner if needed. ? ?Take care, ?Dr Jerline Pain ? ?PLEASE NOTE: ? ?If you had any lab tests please let us know if you have not heard back within a few days. You may see your results on mychart before we have a chance to review them but we will give you a call once they are reviewed by [place]. If we ordered any referrals today, please let us know if you have not heard from their office within the next week.  ? ?Please try these tips to maintain a healthy lifestyle: ? ?Eat at least 3 REAL meals and 1-2 snacks per day.  Aim for no more than 5 hours between eating.  If you eat breakfast, please do so within one hour of getting up.  ? ?Each meal should contain half fruits/vegetables, one quarter protein, and one quarter carbs (no bigger than a computer mouse) ? ?Cut down on sweet beverages. This includes juice, soda, and sweet tea.  ? ?Drink at least 1 glass of water with each meal and aim for at least 8 glasses per day ? ?Exercise at least 150 minutes every week.   ? ?Preventive Care 98-40 Years Old, Female ?Preventive care refers to lifestyle choices and visits with your health care provider that can promote health and wellness. Preventive care visits are also called wellness exams. ?What can I expect for my preventive care visit? ?Counseling ?During your preventive care visit, your health care provider may ask about your: ?Medical history, including: ?Past medical problems. ?Family medical history. ?Pregnancy history. ?Current health, including: ?Menstrual cycle. ?Method of birth control. ?Emotional well-being. ?Home life and relationship well-being. ?Sexual activity and sexual health. ?Lifestyle, including: ?Alcohol, nicotine or tobacco, and drug use. ?Access to firearms. ?Diet, exercise, and sleep habits. ?Work and work Statistician. ?Sunscreen use. ?Safety issues  such as seatbelt and bike helmet use. ?Physical exam ?Your health care provider may check your: ?Height and weight. These may be used to calculate your BMI (body mass index). BMI is a measurement that tells if you are at a healthy weight. ?Waist circumference. This measures the distance around your waistline. This measurement also tells if you are at a healthy weight and may help predict your risk of certain diseases, such as type 2 diabetes and high blood pressure. ?Heart rate and blood pressure. ?Body temperature. ?Skin for abnormal spots. ?What immunizations do I need? ?Vaccines are usually given at various ages, according to a schedule. Your health care provider will recommend vaccines for you based on your age, medical history, and lifestyle or other factors, such as travel or where you work. ?What tests do I need? ?Screening ?Your health care provider may recommend screening tests for certain conditions. This may include: ?Pelvic exam and Pap test. ?Lipid and cholesterol levels. ?Diabetes screening. This is done by checking your blood sugar (glucose) after you have not eaten for a while (fasting). ?Hepatitis B test. ?Hepatitis C test. ?HIV (human immunodeficiency virus) test. ?STI (sexually transmitted infection) testing, if you are at risk. ?BRCA-related cancer screening. This may be done if you have a family history of breast, ovarian, tubal, or peritoneal cancers. ?Talk with your health care provider about your test results, treatment options, and if necessary, the need for more tests. ?Follow these instructions at home: ?Eating and drinking ? ?Eat a  healthy diet that includes fresh fruits and vegetables, whole grains, lean protein, and low-fat dairy products. ?Take vitamin and mineral supplements as recommended by your health care provider. ?Do not drink alcohol if: ?Your health care provider tells you not to drink. ?You are pregnant, may be pregnant, or are planning to become pregnant. ?If you drink  alcohol: ?Limit how much you have to 0-1 drink a day. ?Know how much alcohol is in your drink. In the [place] one drink equals one 12 oz bottle of beer (355 mL), one 5 oz glass of wine (148 mL), or one 1? oz glass of hard liquor (44 mL). ?Lifestyle ?Brush your teeth every morning and night with fluoride toothpaste. Floss one time each day. ?Exercise for at least 30 minutes 5 or more days each week. ?Do not use any products that contain nicotine or tobacco. These products include cigarettes, chewing tobacco, and vaping devices, such as e-cigarettes. If you need help quitting, ask your health care provider. ?Do not use drugs. ?If you are sexually active, practice safe sex. Use a condom or other form of protection to prevent STIs. ?If you do not wish to become pregnant, use a form of birth control. If you plan to become pregnant, see your health care provider for a prepregnancy visit. ?Find healthy ways to manage stress, such as: ?Meditation, yoga, or listening to music. ?Journaling. ?Talking to a trusted person. ?Spending time with friends and family. ?Minimize exposure to UV radiation to reduce your risk of skin cancer. ?Safety ?Always wear your seat belt while driving or riding in a vehicle. ?Do not drive: ?If you have been drinking alcohol. Do not ride with someone who has been drinking. ?If you have been using any mind-altering substances or drugs. ?While texting. ?When you are tired or distracted. ?Wear a helmet and other protective equipment during sports activities. ?If you have firearms in your house, make sure you follow all gun safety procedures. ?Seek help if you have been physically or sexually abused. ?What's next? ?Go to your health care provider once a year for an annual wellness visit. ?Ask your health care provider how often you should have your eyes and teeth checked. ?Stay up to date on all vaccines. ?This information is not intended to replace advice given to you by your health care provider. Make  sure you discuss any questions you have with your health care provider. ?Document Revised: 04/16/2021 Document Reviewed: 04/16/2021 ?Elsevier Patient Education ? [place]. ? ?

## 2022-01-21 NOTE — Progress Notes (Signed)
? ?Chief Complaint:  ?Anne Cline is a 41 y.o. female who presents today for her annual comprehensive physical exam.   ? ?Assessment/Plan:  ?Healthy 41 year old woman doing well.  ? ?Preventative Healthcare: ?Check labs today.  Gets Pap and mammogram done through G[facility].  Due for colon cancer screening age 63. ? ?Patient Counseling(The following topics were reviewed and/or handout was given): ? -Nutrition: Stressed importance of moderation in sodium/caffeine intake, saturated fat and cholesterol, caloric balance, sufficient intake of fresh fruits, vegetables, and fiber. ? -Stressed the importance of regular exercise.  ? -Substance Abuse: Discussed cessation/primary prevention of tobacco, alcohol, or other drug use; driving or other dangerous activities under the influence; availability of treatment for abuse.  ? -Injury prevention: Discussed safety belts, safety helmets, smoke detector, smoking near bedding or upholstery.  ? -Sexuality: Discussed sexually transmitted diseases, partner selection, use of condoms, avoidance of unintended pregnancy and contraceptive alternatives.  ? -Dental health: Discussed importance of regular tooth brushing, flossing, and dental visits. ? -Health maintenance and immunizations reviewed. Please refer to Health maintenance section. ? ?Return to care in 1 year for next preventative visit.  ? ?  ?Subjective:  ?HPI: ? ?She has no acute complaints today.  ? ?Lifestyle ?Diet: Balanced. Plenty of fruits and vegetables.  ?Exercise: Goes on treadmill. Likes swimming.  ? ? ?  01/21/2022  ?  8:43 AM  ?Depression screen PHQ 2/9  ?Decreased Interest 0  ?Down, Depressed, Hopeless 0  ?PHQ - 2 Score 0  ? ? ?Health Maintenance Due  ?Topic Date Due  ? Hepatitis C Screening  Never done  ?  ? ?ROS: Per HPI, otherwise a complete review of systems was negative.  ? ?PMH: ? ?The following were reviewed and entered/updated in epic: ?Past Medical History:  ?Diagnosis Date  ? Medical history non-contributory    ? ?There are no problems to display for this patient. ? ?Past Surgical History:  ?Procedure Laterality Date  ? CESAREAN SECTION N/A 08/22/2017  ? Procedure: CESAREAN SECTION;  Surgeon: Jaymes Graff, MD;  Location: [facility];  Service: Obstetrics;  Laterality: N/A;  ? WISDOM TOOTH EXTRACTION    ? ? ?Family History  ?Problem Relation Age of Onset  ? Cancer Mother   ?     Breast  ? Heart attack Maternal Grandfather   ? ? ?Medications- reviewed and updated ?Current Outpatient Medications  ?Medication Sig Dispense Refill  ? Multiple Vitamin (MULTIVITAMIN) tablet Take 1 tablet by mouth daily.    ? ?No current facility-administered medications for this visit.  ? ? ?Allergies-reviewed and updated ?No Known Allergies ? ?Social History  ? ?Socioeconomic History  ? Marital status: Married  ?  Spouse name: Not on file  ? Number of children: 3  ? Years of education: Not on file  ? Highest education level: Not on file  ?Occupational History  ? Occupation: Psychologist, sport and exercise  ?Tobacco Use  ? Smoking status: Never  ? Smokeless tobacco: Never  ?Vaping Use  ? Vaping Use: Never used  ?Substance and Sexual Activity  ? Alcohol use: Yes  ? Drug use: No  ? Sexual activity: Yes  ?  Partners: Male  ?  Birth control/protection: None  ?Other Topics Concern  ? Not on file  ?Social History Narrative  ? Not on file  ? ?Social Determinants of Health  ? ?Financial Resource Strain: Not on file  ?Food Insecurity: Not on file  ?Transportation Needs: Not on file  ?Physical Activity: Not on file  ?  Stress: Not on file  ?Social Connections: Not on file  ? ?   ?  ?Objective:  ?Physical Exam: ?BP 111/75 (BP Location: Left Arm)   Pulse 90   Temp 98.2 ?F (36.8 ?C) (Temporal)   Ht 5\' 1"  (1.549 m)   Wt 131 lb 12.8 oz (59.8 kg)   LMP 01/07/2022 (Exact Date)   SpO2 98%   BMI 24.90 kg/m?   ?Body mass index is 24.9 kg/m?. ?Wt Readings from Last 3 Encounters:  ?01/21/22 131 lb 12.8 oz (59.8 kg)  ?08/11/18 122 lb 12.8 oz (55.7 kg)  ?09/09/17 119 lb  6.4 oz (54.2 kg)  ? ?Gen: NAD, resting comfortably ?HEENT: TMs normal bilaterally. OP clear. No thyromegaly noted.  ?CV: RRR with no murmurs appreciated ?Pulm: NWOB, CTAB with no crackles, wheezes, or rhonchi ?GI: Normal bowel sounds present. Soft, Nontender, Nondistended. ?MSK: no edema, cyanosis, or clubbing noted ?Skin: warm, dry ?Neuro: CN2-12 grossly intact. Strength 5/5 in upper and lower extremities. Reflexes symmetric and intact bilaterally.  ?Psych: Normal affect and thought content ?   ? ?13/08/18. Katina Degree, MD ?01/21/2022 9:13 AM  ?

## 2022-01-22 NOTE — Progress Notes (Signed)
Please inform patient of the following: ? ?Her cholesterol is borderline but everything else is stable. Do not need to make any changes to her treatment plan at this time but she should continue to work on diet and exercise and we can recheck in a year or so.  ? ?Anne Cline. Jimmey Ralph, MD ?01/22/2022 2:41 PM  ?

## 2022-02-02 DIAGNOSIS — Z01419 Encounter for gynecological examination (general) (routine) without abnormal findings: Secondary | ICD-10-CM | POA: Diagnosis not present

## 2022-02-02 DIAGNOSIS — Z6824 Body mass index (BMI) 24.0-24.9, adult: Secondary | ICD-10-CM | POA: Diagnosis not present

## 2022-02-02 DIAGNOSIS — Z3040 Encounter for surveillance of contraceptives, unspecified: Secondary | ICD-10-CM | POA: Diagnosis not present

## 2022-02-02 DIAGNOSIS — Z803 Family history of malignant neoplasm of breast: Secondary | ICD-10-CM | POA: Diagnosis not present

## 2022-02-02 DIAGNOSIS — Z1231 Encounter for screening mammogram for malignant neoplasm of breast: Secondary | ICD-10-CM | POA: Diagnosis not present

## 2022-02-10 ENCOUNTER — Ambulatory Visit: Payer: 59 | Admitting: Family Medicine

## 2022-02-10 VITALS — BP 116/83 | HR 87 | Temp 98.6°F | Ht 61.0 in | Wt 130.2 lb

## 2022-02-10 DIAGNOSIS — J329 Chronic sinusitis, unspecified: Secondary | ICD-10-CM

## 2022-02-10 DIAGNOSIS — H6122 Impacted cerumen, left ear: Secondary | ICD-10-CM | POA: Diagnosis not present

## 2022-02-10 MED ORDER — DOXYCYCLINE HYCLATE 100 MG PO TABS: 100.0000 mg | ORAL_TABLET | Freq: Two times a day (BID) | ORAL | 0 refills | Status: DC

## 2022-02-10 MED ORDER — AZELASTINE HCL 0.1 % NA SOLN: 2.0000 | Freq: Two times a day (BID) | NASAL | 12 refills | Status: DC

## 2022-02-10 NOTE — Progress Notes (Signed)
? ?  Anne Cline is a 41 y.o. female who presents today for an office visit. ? ?Assessment/Plan:  ?Cerumen Impaction ?Successfully irrigated by CMA today.  She tolerated well.  Can use over-the-counter Debrox or hydrogen peroxide as needed to prevent buildup ? ?Sinusitis ?No red flags.  Will start Astelin nasal spray.  We will send in pocket prescription for doxycycline with instruction to not start unless symptoms do not improve in the next 2 days.  She can continue using over-the-counter meds as needed.  We discussed reasons to return to care.  Follow-up as needed. ? ? ?  ?Subjective:  ?HPI: ? ?Patient with fullness in the left ear.  Started a few days ago.  She is also concerned about possible sinus pressure.  She has had more clearing her throat.  No fevers or chills.  Tried taking Tylenol with modest improvement.  She had something similar a couple of years ago but turned into a sinus infection.  No chest pain or shortness of breath. ? ? ? ?   ?  ?Objective:  ?Physical Exam: ?BP 116/83 (BP Location: Left Arm)   Pulse 87   Temp 98.6 ?F (37 ?C) (Temporal)   Ht 5\' 1"  (1.549 m)   Wt 130 lb 3.2 oz (59.1 kg)   SpO2 99%   BMI 24.60 kg/m?   ?Gen: No acute distress, resting comfortably ?HEENT: EAC initially obscured by cerumen.  After successful irrigation by CMA TM was visualized with slight erythema and effusion. ?CV: Regular rate and rhythm with no murmurs appreciated ?Pulm: Normal work of breathing, clear to auscultation bilaterally with no crackles, wheezes, or rhonchi ?Neuro: Grossly normal, moves all extremities ?Psych: Normal affect and thought content ? ?   ? ?Algis Greenhouse. Jerline Pain, MD ?02/10/2022 10:25 AM  ?

## 2022-04-23 DIAGNOSIS — N912 Amenorrhea, unspecified: Secondary | ICD-10-CM | POA: Diagnosis not present

## 2022-04-29 DIAGNOSIS — E221 Hyperprolactinemia: Secondary | ICD-10-CM | POA: Diagnosis not present

## 2022-07-14 DIAGNOSIS — Z23 Encounter for immunization: Secondary | ICD-10-CM | POA: Diagnosis not present

## 2022-07-27 ENCOUNTER — Encounter: Payer: Self-pay | Admitting: *Deleted

## 2022-09-16 DIAGNOSIS — Q231 Congenital insufficiency of aortic valve: Secondary | ICD-10-CM | POA: Diagnosis not present

## 2022-09-16 DIAGNOSIS — I77810 Thoracic aortic ectasia: Secondary | ICD-10-CM | POA: Diagnosis not present

## 2022-09-16 DIAGNOSIS — Z952 Presence of prosthetic heart valve: Secondary | ICD-10-CM | POA: Diagnosis not present

## 2022-10-07 DIAGNOSIS — J029 Acute pharyngitis, unspecified: Secondary | ICD-10-CM | POA: Diagnosis not present

## 2022-10-13 ENCOUNTER — Ambulatory Visit: Payer: 59 | Admitting: Family

## 2022-10-13 ENCOUNTER — Encounter: Payer: Self-pay | Admitting: Family

## 2022-10-13 VITALS — BP 124/85 | HR 96 | Temp 98.2°F | Ht 61.0 in | Wt 128.0 lb

## 2022-10-13 DIAGNOSIS — R053 Chronic cough: Secondary | ICD-10-CM | POA: Diagnosis not present

## 2022-10-13 MED ORDER — AZITHROMYCIN 250 MG PO TABS: ORAL_TABLET | ORAL | 0 refills | Status: AC

## 2022-10-13 MED ORDER — METHYLPREDNISOLONE ACETATE 80 MG/ML IJ SUSP
80.0000 mg | Freq: Once | INTRAMUSCULAR | Status: AC
  Administered 2022-10-13: 80 mg via INTRAMUSCULAR

## 2022-10-13 NOTE — Progress Notes (Signed)
Patient ID: Anne Cline, female    DOB: January 16, 1981, 41 y.o.   MRN: [id number]  Chief Complaint  Patient presents with   Cough   HPI:      URI sx:  Pt c/o Cough, sore throat and SOB for about 2 weeks. Clear nasal drainage & postnasal drip with chest Congestion and green mucus, Covid and strep negative when see in UC on 12/6. Reports dtr also sick w/same sx and given steroids.  Has tried tylenol cold/flu and hot tea which did not help.      Assessment & Plan:  1. Persistent cough - given steroid injection in office, sending Zpack to pharmacy, advised on use & SE, use humidifier overnight, increasing water intake to 2L/d, nasal saline spray tid, OTC generic Claritin or Xyzal qd.   - azithromycin (ZITHROMAX) 250 MG tablet; Take 2 tablets on day 1, then 1 tablet daily on days 2 through 5  Dispense: 6 tablet; Refill: 0 - methylPREDNISolone acetate (DEPO-MEDROL) injection 80 mg   Subjective:    Outpatient Medications Prior to Visit  Medication Sig Dispense Refill   Multiple Vitamin (MULTIVITAMIN) tablet Take 1 tablet by mouth daily.     doxycycline (VIBRA-TABS) 100 MG tablet Take 1 tablet (100 mg total) by mouth 2 (two) times daily. 14 tablet 0   azelastine (ASTELIN) 0.1 % nasal spray Place 2 sprays into both nostrils 2 (two) times daily. (Patient not taking: Reported on 10/13/2022) 30 mL 12   No facility-administered medications prior to visit.   Past Medical History:  Diagnosis Date   Medical history non-contributory    Past Surgical History:  Procedure Laterality Date   CESAREAN SECTION N/A 08/22/2017   Procedure: CESAREAN SECTION;  Surgeon: Jaymes Graff, MD;  Location: [facility];  Service: Obstetrics;  Laterality: N/A;   WISDOM TOOTH EXTRACTION     No Known Allergies    Objective:    Physical Exam Vitals and nursing note reviewed.  Constitutional:      Appearance: Normal appearance. She is ill-appearing.     Interventions: Face mask in place.  HENT:      Right Ear: Tympanic membrane and ear canal normal.     Left Ear: Tympanic membrane and ear canal normal.     Nose:     Right Sinus: No frontal sinus tenderness.     Left Sinus: No frontal sinus tenderness.     Mouth/Throat:     Mouth: Mucous membranes are moist.     Pharynx: Posterior oropharyngeal erythema (mild) present. No pharyngeal swelling, oropharyngeal exudate or uvula swelling.     Tonsils: No tonsillar exudate or tonsillar abscesses.  Cardiovascular:     Rate and Rhythm: Normal rate and regular rhythm.  Pulmonary:     Effort: Pulmonary effort is normal.     Breath sounds: Normal breath sounds.  Musculoskeletal:        General: Normal range of motion.  Lymphadenopathy:     Head:     Right side of head: No preauricular or posterior auricular adenopathy.     Left side of head: No preauricular or posterior auricular adenopathy.     Cervical: No cervical adenopathy.  Skin:    General: Skin is warm and dry.  Neurological:     Mental Status: She is alert.  Psychiatric:        Mood and Affect: Mood normal.        Behavior: Behavior normal.    BP 124/85 (BP Location: Left Arm,  Patient Position: Sitting, Cuff Size: Normal)   Pulse 96   Temp 98.2 F (36.8 C) (Temporal)   Ht 5\' 1"  (1.549 m)   Wt 128 lb (58.1 kg)   LMP 10/08/2022 (Exact Date)   SpO2 99%   BMI 24.19 kg/m  Wt Readings from Last 3 Encounters:  10/13/22 128 lb (58.1 kg)  02/10/22 130 lb 3.2 oz (59.1 kg)  01/21/22 131 lb 12.8 oz (59.8 kg)       01/23/22, NP

## 2023-01-05 DIAGNOSIS — Z124 Encounter for screening for malignant neoplasm of cervix: Secondary | ICD-10-CM | POA: Diagnosis not present

## 2023-01-05 DIAGNOSIS — Z1231 Encounter for screening mammogram for malignant neoplasm of breast: Secondary | ICD-10-CM | POA: Diagnosis not present

## 2023-01-08 DIAGNOSIS — D2272 Melanocytic nevi of left lower limb, including hip: Secondary | ICD-10-CM | POA: Diagnosis not present

## 2023-01-08 DIAGNOSIS — D225 Melanocytic nevi of trunk: Secondary | ICD-10-CM | POA: Diagnosis not present

## 2023-01-08 DIAGNOSIS — D2262 Melanocytic nevi of left upper limb, including shoulder: Secondary | ICD-10-CM | POA: Diagnosis not present

## 2023-01-08 DIAGNOSIS — D2261 Melanocytic nevi of right upper limb, including shoulder: Secondary | ICD-10-CM | POA: Diagnosis not present

## 2023-01-08 DIAGNOSIS — D2271 Melanocytic nevi of right lower limb, including hip: Secondary | ICD-10-CM | POA: Diagnosis not present

## 2023-01-08 DIAGNOSIS — D1801 Hemangioma of skin and subcutaneous tissue: Secondary | ICD-10-CM | POA: Diagnosis not present

## 2023-01-08 DIAGNOSIS — D485 Neoplasm of uncertain behavior of skin: Secondary | ICD-10-CM | POA: Diagnosis not present

## 2023-11-05 ENCOUNTER — Telehealth: Payer: Self-pay | Admitting: *Deleted

## 2023-11-05 NOTE — Telephone Encounter (Signed)
 Copied from CRM [id number]. Topic: Clinical - Medical Advice >> Nov 05, 2023 10:40 AM Anne Cline wrote: Reason for CRM: Patient called in requesting a zpack, would like to speak to a nurse. I tried to schedule an appointment but there were none available today. She can be contacted at  > [phone]    Called patient LVM to schedule office visit for assessment and treatment

## 2023-11-09 DIAGNOSIS — J0100 Acute maxillary sinusitis, unspecified: Secondary | ICD-10-CM | POA: Diagnosis not present

## 2023-11-09 DIAGNOSIS — Z6823 Body mass index (BMI) 23.0-23.9, adult: Secondary | ICD-10-CM | POA: Diagnosis not present

## 2023-11-25 ENCOUNTER — Other Ambulatory Visit: Payer: Self-pay | Admitting: Obstetrics and Gynecology

## 2023-11-25 DIAGNOSIS — Z1231 Encounter for screening mammogram for malignant neoplasm of breast: Secondary | ICD-10-CM

## 2024-01-05 DIAGNOSIS — L679 Hair color and hair shaft abnormality, unspecified: Secondary | ICD-10-CM | POA: Diagnosis not present

## 2024-01-05 DIAGNOSIS — L659 Nonscarring hair loss, unspecified: Secondary | ICD-10-CM | POA: Diagnosis not present

## 2024-01-05 DIAGNOSIS — Z803 Family history of malignant neoplasm of breast: Secondary | ICD-10-CM | POA: Diagnosis not present

## 2024-01-05 DIAGNOSIS — Z3040 Encounter for surveillance of contraceptives, unspecified: Secondary | ICD-10-CM | POA: Diagnosis not present

## 2024-01-05 DIAGNOSIS — Z6824 Body mass index (BMI) 24.0-24.9, adult: Secondary | ICD-10-CM | POA: Diagnosis not present

## 2024-01-05 DIAGNOSIS — Z1330 Encounter for screening examination for mental health and behavioral disorders, unspecified: Secondary | ICD-10-CM | POA: Diagnosis not present

## 2024-01-05 DIAGNOSIS — Z01419 Encounter for gynecological examination (general) (routine) without abnormal findings: Secondary | ICD-10-CM | POA: Diagnosis not present

## 2024-01-05 DIAGNOSIS — N939 Abnormal uterine and vaginal bleeding, unspecified: Secondary | ICD-10-CM | POA: Diagnosis not present

## 2024-01-11 DIAGNOSIS — E559 Vitamin D deficiency, unspecified: Secondary | ICD-10-CM | POA: Diagnosis not present

## 2024-01-11 DIAGNOSIS — D2272 Melanocytic nevi of left lower limb, including hip: Secondary | ICD-10-CM | POA: Diagnosis not present

## 2024-01-11 DIAGNOSIS — D2271 Melanocytic nevi of right lower limb, including hip: Secondary | ICD-10-CM | POA: Diagnosis not present

## 2024-01-11 DIAGNOSIS — D225 Melanocytic nevi of trunk: Secondary | ICD-10-CM | POA: Diagnosis not present

## 2024-01-11 DIAGNOSIS — D2261 Melanocytic nevi of right upper limb, including shoulder: Secondary | ICD-10-CM | POA: Diagnosis not present

## 2024-01-11 DIAGNOSIS — D2361 Other benign neoplasm of skin of right upper limb, including shoulder: Secondary | ICD-10-CM | POA: Diagnosis not present

## 2024-01-11 DIAGNOSIS — R891 Abnormal level of hormones in specimens from other organs, systems and tissues: Secondary | ICD-10-CM | POA: Diagnosis not present

## 2024-01-11 DIAGNOSIS — N939 Abnormal uterine and vaginal bleeding, unspecified: Secondary | ICD-10-CM | POA: Diagnosis not present

## 2024-01-11 DIAGNOSIS — D1801 Hemangioma of skin and subcutaneous tissue: Secondary | ICD-10-CM | POA: Diagnosis not present

## 2024-01-11 DIAGNOSIS — D2262 Melanocytic nevi of left upper limb, including shoulder: Secondary | ICD-10-CM | POA: Diagnosis not present

## 2024-01-11 DIAGNOSIS — D2239 Melanocytic nevi of other parts of face: Secondary | ICD-10-CM | POA: Diagnosis not present

## 2024-01-11 DIAGNOSIS — L821 Other seborrheic keratosis: Secondary | ICD-10-CM | POA: Diagnosis not present

## 2024-05-15 DIAGNOSIS — Z111 Encounter for screening for respiratory tuberculosis: Secondary | ICD-10-CM | POA: Diagnosis not present
